# Patient Record
Sex: Female | Born: 1955 | Race: White | Hispanic: No | Smoking: Never smoker
Health system: Southern US, Community
[De-identification: ages and names within clinical notes are randomized; demographics above are authoritative.]

## PROBLEM LIST (undated history)

## (undated) DIAGNOSIS — H9190 Unspecified hearing loss, unspecified ear: Secondary | ICD-10-CM

## (undated) DIAGNOSIS — F419 Anxiety disorder, unspecified: Secondary | ICD-10-CM

## (undated) DIAGNOSIS — F32A Depression, unspecified: Secondary | ICD-10-CM

## (undated) DIAGNOSIS — D72819 Decreased white blood cell count, unspecified: Secondary | ICD-10-CM

## (undated) DIAGNOSIS — B059 Measles without complication: Secondary | ICD-10-CM

## (undated) DIAGNOSIS — K589 Irritable bowel syndrome without diarrhea: Secondary | ICD-10-CM

## (undated) DIAGNOSIS — R011 Cardiac murmur, unspecified: Secondary | ICD-10-CM

## (undated) DIAGNOSIS — F329 Major depressive disorder, single episode, unspecified: Secondary | ICD-10-CM

## (undated) DIAGNOSIS — G4700 Insomnia, unspecified: Secondary | ICD-10-CM

## (undated) DIAGNOSIS — G250 Essential tremor: Secondary | ICD-10-CM

## (undated) DIAGNOSIS — B019 Varicella without complication: Secondary | ICD-10-CM

## (undated) DIAGNOSIS — D649 Anemia, unspecified: Secondary | ICD-10-CM

## (undated) DIAGNOSIS — R32 Unspecified urinary incontinence: Secondary | ICD-10-CM

## (undated) DIAGNOSIS — F909 Attention-deficit hyperactivity disorder, unspecified type: Secondary | ICD-10-CM

## (undated) DIAGNOSIS — H269 Unspecified cataract: Secondary | ICD-10-CM

## (undated) DIAGNOSIS — I493 Ventricular premature depolarization: Secondary | ICD-10-CM

## (undated) DIAGNOSIS — R002 Palpitations: Secondary | ICD-10-CM

## (undated) DIAGNOSIS — M1990 Unspecified osteoarthritis, unspecified site: Secondary | ICD-10-CM

## (undated) DIAGNOSIS — I341 Nonrheumatic mitral (valve) prolapse: Secondary | ICD-10-CM

## (undated) HISTORY — DX: Nonrheumatic mitral (valve) prolapse: I34.1

## (undated) HISTORY — DX: Decreased white blood cell count, unspecified: D72.819

## (undated) HISTORY — DX: Anemia, unspecified: D64.9

## (undated) HISTORY — DX: Varicella without complication: B01.9

## (undated) HISTORY — DX: Depression, unspecified: F32.A

## (undated) HISTORY — DX: Irritable bowel syndrome, unspecified: K58.9

## (undated) HISTORY — DX: Ventricular premature depolarization: I49.3

## (undated) HISTORY — DX: Unspecified osteoarthritis, unspecified site: M19.90

## (undated) HISTORY — DX: Unspecified hearing loss, unspecified ear: H91.90

## (undated) HISTORY — DX: Cardiac murmur, unspecified: R01.1

## (undated) HISTORY — DX: Essential tremor: G25.0

## (undated) HISTORY — DX: Insomnia, unspecified: G47.00

## (undated) HISTORY — DX: Anxiety disorder, unspecified: F41.9

## (undated) HISTORY — DX: Major depressive disorder, single episode, unspecified: F32.9

## (undated) HISTORY — DX: Unspecified cataract: H26.9

## (undated) HISTORY — DX: Unspecified urinary incontinence: R32

## (undated) HISTORY — DX: Palpitations: R00.2

## (undated) HISTORY — DX: Measles without complication: B05.9

## (undated) HISTORY — DX: Attention-deficit hyperactivity disorder, unspecified type: F90.9

---

## 1982-07-29 HISTORY — PX: INGUINAL HERNIA REPAIR: SUR1180

## 1991-07-30 HISTORY — PX: KNEE ARTHROSCOPY: SUR90

## 2005-07-15 ENCOUNTER — Ambulatory Visit: Payer: Self-pay | Admitting: Family Medicine

## 2005-08-22 ENCOUNTER — Ambulatory Visit: Payer: Self-pay | Admitting: General Surgery

## 2006-02-27 ENCOUNTER — Ambulatory Visit: Payer: Self-pay | Admitting: Internal Medicine

## 2009-04-12 ENCOUNTER — Encounter: Payer: Self-pay | Admitting: Family Medicine

## 2009-04-28 ENCOUNTER — Encounter: Payer: Self-pay | Admitting: Family Medicine

## 2009-05-29 ENCOUNTER — Encounter: Payer: Self-pay | Admitting: Family Medicine

## 2015-04-28 ENCOUNTER — Ambulatory Visit (INDEPENDENT_AMBULATORY_CARE_PROVIDER_SITE_OTHER): Payer: BLUE CROSS/BLUE SHIELD | Admitting: Primary Care

## 2015-04-28 ENCOUNTER — Encounter: Payer: Self-pay | Admitting: Primary Care

## 2015-04-28 ENCOUNTER — Encounter (INDEPENDENT_AMBULATORY_CARE_PROVIDER_SITE_OTHER): Payer: Self-pay

## 2015-04-28 VITALS — BP 124/84 | HR 90 | Temp 97.9°F | Ht 70.0 in | Wt 183.1 lb

## 2015-04-28 DIAGNOSIS — F909 Attention-deficit hyperactivity disorder, unspecified type: Secondary | ICD-10-CM | POA: Diagnosis not present

## 2015-04-28 DIAGNOSIS — F329 Major depressive disorder, single episode, unspecified: Secondary | ICD-10-CM

## 2015-04-28 DIAGNOSIS — F32A Depression, unspecified: Secondary | ICD-10-CM

## 2015-04-28 MED ORDER — BUPROPION HCL ER (XL) 300 MG PO TB24: 300.0000 mg | ORAL_TABLET | Freq: Every day | ORAL | Status: DC

## 2015-04-28 NOTE — Assessment & Plan Note (Signed)
Diagnosed in [place] by psychologist 10 years ago. She is currently managed on Wellbutrin XL 300 mg daily. She is interested in being restested and would like to do so in [place]. Offered information with [place], she prefers [place]. She will call and notify me of her visit and is to fax me your report.

## 2015-04-28 NOTE — Progress Notes (Signed)
Subjective:    Patient ID: Karen Prince, female    DOB: 11-Nov-1955, 59 y.o.   MRN: [id number]  HPI  Karen Prince is a 59 year old female who presents today to establish care and discuss the problems mentioned below. Will obtain old records. Her last physical was earlier this year.  1) Depression and ADHD: Diagnosed in March of 2007. She has been on Wellbutrin XL 300 mg daily for years. She feels decently managed on this medication at this dose. She has not been evaluated by psychiatry recently. She was once evaluated by a clinical psychologist for ADHD 10 years ago in [place] and would like re-evaluation. PHQ 2 score of 0 today. She is requesting a refill of her medication today.  2) Insomnia: Present most of her life. She has difficulty falling asleep and is managed on Sonata as needed. She doesn't wake up during the night, denies snoring, restless leg but will have occasional "squirmy legs".     Review of Systems  Constitutional: Negative for unexpected weight change.  HENT: Negative for rhinorrhea.   Respiratory: Negative for cough and shortness of breath.   Cardiovascular: Negative for chest pain.  Genitourinary: Negative for difficulty urinating.  Musculoskeletal: Positive for arthralgias.       Chronic shoulder and knee pain, not bothersome daily  Skin: Negative for rash.  Allergic/Immunologic: Negative for environmental allergies.  Neurological: Negative for dizziness, numbness and headaches.  Psychiatric/Behavioral:       See HPI       Past Medical History  Diagnosis Date  . Mitral valve prolapse   . Anemia   . ADHD (attention deficit hyperactivity disorder)   . Depression   . IBS (irritable bowel syndrome)   . Insomnia   . Essential tremor   . PVC (premature ventricular contraction)     Social History   Social History  . Marital Status: Single    Spouse Name: N/A  . Number of Children: N/A  . Years of Education: N/A   Occupational History  . Not on file.    Social History Main Topics  . Smoking status: Never Smoker   . Smokeless tobacco: Not on file  . Alcohol Use: 0.0 oz/week    0 Standard drinks or equivalent per week     Comment: occ  . Drug Use: Not on file  . Sexual Activity: Not on file   Other Topics Concern  . Not on file   Social History Narrative   Single, has a partner.   Works as a Professor of Physical Therapy.   Works at [employer].   Enjoys photography, travel, being outdoors, relaxing.    No past surgical history on file.  Family History  Problem Relation Age of Onset  . Hypertension Father   . Macular degeneration Father   . Heart disease Father   . Melanoma Mother   . Anuerysm Mother     Abdominal    No Known Allergies  No current outpatient prescriptions on file prior to visit.   No current facility-administered medications on file prior to visit.    BP 124/84 mmHg  Pulse 90  Temp(Src) 97.9 F (36.6 C) (Oral)  Ht 5\' 10"  (1.778 m)  Wt 183 lb 1.9 oz (83.063 kg)  BMI 26.28 kg/m2  SpO2 98%    Objective:   Physical Exam  Constitutional: She is oriented to person, place, and time. She appears well-nourished.  Cardiovascular: Normal rate and regular rhythm.   Pulmonary/Chest: Effort normal and  breath sounds normal.  Neurological: She is alert and oriented to person, place, and time.  Skin: Skin is warm and dry.  Psychiatric: She has a normal mood and affect.          Assessment & Plan:

## 2015-04-28 NOTE — Progress Notes (Signed)
Pre visit review using our clinic review tool, if applicable. No additional management support is needed unless otherwise documented below in the visit note. 

## 2015-04-28 NOTE — Assessment & Plan Note (Signed)
Diagnosed in March 2007 and is currently managed on Wellbutrin XL 300 mg daily. She feels well managed at this dose. PHQ 2 score of 0 today. Denies SI/HI. Will continue to monitor.

## 2015-04-28 NOTE — Patient Instructions (Addendum)
Refills have been provided of your Wellbutrin XL 300 mg.   Please contact the [facility] office regarding re-evaluation and testing and notify me if you need any assistance. Please have them fax their report to me.  Follow up in 6 months for re-evaluation.  It was a pleasure to meet you today! Please don't hesitate to call me with any questions. Welcome to [facility]!

## 2015-07-26 ENCOUNTER — Other Ambulatory Visit: Payer: Self-pay | Admitting: Primary Care

## 2015-10-26 ENCOUNTER — Encounter: Payer: Self-pay | Admitting: Primary Care

## 2015-10-26 ENCOUNTER — Ambulatory Visit (INDEPENDENT_AMBULATORY_CARE_PROVIDER_SITE_OTHER): Payer: BLUE CROSS/BLUE SHIELD | Admitting: Primary Care

## 2015-10-26 VITALS — BP 124/74 | HR 105 | Temp 99.0°F | Ht 70.0 in | Wt 185.8 lb

## 2015-10-26 DIAGNOSIS — G4700 Insomnia, unspecified: Secondary | ICD-10-CM

## 2015-10-26 DIAGNOSIS — F32A Depression, unspecified: Secondary | ICD-10-CM

## 2015-10-26 DIAGNOSIS — Z1211 Encounter for screening for malignant neoplasm of colon: Secondary | ICD-10-CM | POA: Diagnosis not present

## 2015-10-26 DIAGNOSIS — N390 Urinary tract infection, site not specified: Secondary | ICD-10-CM

## 2015-10-26 DIAGNOSIS — F329 Major depressive disorder, single episode, unspecified: Secondary | ICD-10-CM | POA: Diagnosis not present

## 2015-10-26 LAB — POC URINALSYSI DIPSTICK (AUTOMATED)
Bilirubin, UA: NEGATIVE
Blood, UA: NEGATIVE
Glucose, UA: NEGATIVE
Ketones, UA: NEGATIVE
NITRITE UA: POSITIVE
PH UA: 6
Spec Grav, UA: 1.02
UROBILINOGEN UA: NEGATIVE

## 2015-10-26 MED ORDER — CIPROFLOXACIN HCL 500 MG PO TABS: 500.0000 mg | ORAL_TABLET | Freq: Two times a day (BID) | ORAL | Status: DC

## 2015-10-26 NOTE — Progress Notes (Signed)
Subjective:    Patient ID: Karen Prince, female    DOB: 15-Jan-1956, 60 y.o.   MRN: [id number]  HPI  Karen Prince is a 60 year old female who presents today for follow up and a chief complaint of flank pain.  1) Depression: Currently following with Dr. Nicolasa Ducking with psychiatry. Diagnosed in March 2007, and currently managed on Wellbutrin XL 300 mg. She is also managed on Cymbalta 60 mg and Zaleplon 10 mg. The Cymbalta was initiated in December 2016 by Dr. Nicolasa Ducking. Her next follow up with Dr. Nicolasa Ducking is in 1 month.   2) Flank Pain: She was recently evaluated by her GYN for complaints of vaginal discharge with foul odor. She was diagnosed with bacterial vaginosis and completed Metrogel on Friday March 24. She noticed pelvic pressure, hematuria, and cloudy urine that began on Thursday March 23rd. 2 days ago she noticed bilateral flank pain, and this morning she's noticed "side pain". Denies fevers. She's taken advil cold and sinus last night as she's also had a cough. Overall her cough is improved.   3) Cough: Present for the past 2 weeks. Overall feeling better. Denies fevers, shortness of breath. Her cough was productive with clear/yellow sputum.   Review of Systems  Constitutional: Negative for fever and fatigue.  HENT: Negative for congestion and sinus pressure.   Respiratory: Positive for cough. Negative for shortness of breath.   Genitourinary: Positive for frequency, hematuria, flank pain and pelvic pain. Negative for dysuria.       Past Medical History  Diagnosis Date  . Mitral valve prolapse   . Anemia   . ADHD (attention deficit hyperactivity disorder)   . Depression   . IBS (irritable bowel syndrome)   . Insomnia   . Essential tremor   . PVC (premature ventricular contraction)   . Hearing loss   . Leucopenia   . Chicken pox   . Measles     Social History   Social History  . Marital Status: Single    Spouse Name: N/A  . Number of Children: N/A  . Years of Education:  N/A   Occupational History  . Not on file.   Social History Main Topics  . Smoking status: Never Smoker   . Smokeless tobacco: Not on file  . Alcohol Use: 0.0 oz/week    0 Standard drinks or equivalent per week     Comment: occ  . Drug Use: Not on file  . Sexual Activity: Not on file   Other Topics Concern  . Not on file   Social History Narrative   Single, has a partner.   Works as a Professor of Physical Therapy.   Works at [employer].   Enjoys photography, travel, being outdoors, relaxing.    Past Surgical History  Procedure Laterality Date  . Inguinal hernia repair  1984  . Knee arthroscopy Right 1993    Family History  Problem Relation Age of Onset  . Hypertension Father   . Macular degeneration Father   . Heart disease Father   . Melanoma Mother   . Anuerysm Mother     Abdominal  . Emphysema Mother   . Emphysema Father   .       No Known Allergies  Current Outpatient Prescriptions on File Prior to Visit  Medication Sig Dispense Refill  . Cholecalciferol (VITAMIN D3) 2000 UNITS capsule Take 2,000 Units by mouth daily.     . OMEGA-3 FATTY ACIDS-VITAMIN E PO Take 1,000 mg by mouth daily.    Marland Kitchen  zaleplon (SONATA) 10 MG capsule Take by mouth.     No current facility-administered medications on file prior to visit.    BP 124/74 mmHg  Pulse 105  Temp(Src) 99 F (37.2 C) (Oral)  Ht 5\' 10"  (1.778 m)  Wt 185 lb 12.8 oz (84.278 kg)  BMI 26.66 kg/m2  SpO2 98%    Objective:   Physical Exam  Constitutional: She appears well-nourished.  Cardiovascular: Normal rate and regular rhythm.   Pulmonary/Chest: Effort normal and breath sounds normal. She has no wheezes. She has no rales.  Abdominal: Soft. Bowel sounds are normal. There is no tenderness. There is no CVA tenderness.  Skin: Skin is warm and dry.          Assessment & Plan:  Urinary Tract Infection:  Recent treatment for BV per GYN. Now with flank pain, pelvic discomfort, frequency. Exam  unremarkable. UA: 3+ leuks, positive for nitrites, negative for blood.  Culture sent. Will treat with Cipro course. 7 days necessary due to recent treatment for BV. Return precautions provided.

## 2015-10-26 NOTE — Assessment & Plan Note (Signed)
Long history of, managed on Zaleplon per psych.

## 2015-10-26 NOTE — Patient Instructions (Signed)
Start ciprofloxacin antibiotics. Take 1 tablet by mouth twice daily for 7 days.  You will be contacted regarding your Colonoscopy.  Please let us know if you have not heard back within one week.   Please schedule a physical with me within the next 3 months. You may also schedule a lab only appointment 3-4 days prior. We will discuss your lab results in detail during your physical.  It was a pleasure to see you today!  Urinary Tract Infection Urinary tract infections (UTIs) can develop anywhere along your urinary tract. Your urinary tract is your body's drainage system for removing wastes and extra water. Your urinary tract includes two kidneys, two ureters, a bladder, and a urethra. Your kidneys are a pair of bean-shaped organs. Each kidney is about the size of your fist. They are located below your ribs, one on each side of your spine. CAUSES Infections are caused by microbes, which are microscopic organisms, including fungi, viruses, and bacteria. These organisms are so small that they can only be seen through a microscope. Bacteria are the microbes that most commonly cause UTIs. SYMPTOMS  Symptoms of UTIs may vary by age and gender of the patient and by the location of the infection. Symptoms in young women typically include a frequent and intense urge to urinate and a painful, burning feeling in the bladder or urethra during urination. Older women and men are more likely to be tired, shaky, and weak and have muscle aches and abdominal pain. A fever may mean the infection is in your kidneys. Other symptoms of a kidney infection include pain in your back or sides below the ribs, nausea, and vomiting. DIAGNOSIS To diagnose a UTI, your caregiver will ask you about your symptoms. Your caregiver will also ask you to provide a urine sample. The urine sample will be tested for bacteria and white blood cells. White blood cells are made by your body to help fight infection. TREATMENT  Typically, UTIs can  be treated with medication. Because most UTIs are caused by a bacterial infection, they usually can be treated with the use of antibiotics. The choice of antibiotic and length of treatment depend on your symptoms and the type of bacteria causing your infection. HOME CARE INSTRUCTIONS  If you were prescribed antibiotics, take them exactly as your caregiver instructs you. Finish the medication even if you feel better after you have only taken some of the medication.  Drink enough water and fluids to keep your urine clear or pale yellow.  Avoid caffeine, tea, and carbonated beverages. They tend to irritate your bladder.  Empty your bladder often. Avoid holding urine for long periods of time.  Empty your bladder before and after sexual intercourse.  After a bowel movement, women should cleanse from front to back. Use each tissue only once. SEEK MEDICAL CARE IF:   You have back pain.  You develop a fever.  Your symptoms do not begin to resolve within 3 days. SEEK IMMEDIATE MEDICAL CARE IF:   You have severe back pain or lower abdominal pain.  You develop chills.  You have nausea or vomiting.  You have continued burning or discomfort with urination. MAKE SURE YOU:   Understand these instructions.  Will watch your condition.  Will get help right away if you are not doing well or get worse.   This information is not intended to replace advice given to you by your health care provider. Make sure you discuss any questions you have with your health care  provider.   Document Released: 04/24/2005 Document Revised: 04/05/2015 Document Reviewed: 08/23/2011 Elsevier Interactive Patient Education [employer].

## 2015-10-26 NOTE — Progress Notes (Signed)
Pre visit review using our clinic review tool, if applicable. No additional management support is needed unless otherwise documented below in the visit note. 

## 2015-10-26 NOTE — Assessment & Plan Note (Signed)
Currently following with Psych. Managed on Wellbutrin XL 300 mg and Cymbalta 60 mg. Feels well managed, next follow up due in 1 month.

## 2015-10-28 LAB — URINE CULTURE: Colony Count: 80000

## 2016-01-04 ENCOUNTER — Ambulatory Visit: Payer: BLUE CROSS/BLUE SHIELD | Admitting: Anesthesiology

## 2016-01-04 ENCOUNTER — Encounter: Payer: Self-pay | Admitting: Anesthesiology

## 2016-01-04 ENCOUNTER — Ambulatory Visit
Admission: RE | Admit: 2016-01-04 | Discharge: 2016-01-04 | Disposition: A | Discharge status: Home / Self Care | Payer: BLUE CROSS/BLUE SHIELD | Source: Ambulatory Visit | Attending: Unknown Physician Specialty | Admitting: Unknown Physician Specialty

## 2016-01-04 ENCOUNTER — Encounter
Admission: RE | Disposition: A | Discharge status: Home / Self Care | Payer: Self-pay | Source: Ambulatory Visit | Attending: Unknown Physician Specialty

## 2016-01-04 DIAGNOSIS — K640 First degree hemorrhoids: Secondary | ICD-10-CM | POA: Diagnosis not present

## 2016-01-04 DIAGNOSIS — G250 Essential tremor: Secondary | ICD-10-CM | POA: Diagnosis not present

## 2016-01-04 DIAGNOSIS — Z1211 Encounter for screening for malignant neoplasm of colon: Secondary | ICD-10-CM | POA: Insufficient documentation

## 2016-01-04 DIAGNOSIS — F909 Attention-deficit hyperactivity disorder, unspecified type: Secondary | ICD-10-CM | POA: Insufficient documentation

## 2016-01-04 DIAGNOSIS — D120 Benign neoplasm of cecum: Secondary | ICD-10-CM | POA: Insufficient documentation

## 2016-01-04 DIAGNOSIS — I341 Nonrheumatic mitral (valve) prolapse: Secondary | ICD-10-CM | POA: Diagnosis not present

## 2016-01-04 DIAGNOSIS — F329 Major depressive disorder, single episode, unspecified: Secondary | ICD-10-CM | POA: Insufficient documentation

## 2016-01-04 DIAGNOSIS — K5730 Diverticulosis of large intestine without perforation or abscess without bleeding: Secondary | ICD-10-CM | POA: Diagnosis not present

## 2016-01-04 HISTORY — PX: COLONOSCOPY WITH PROPOFOL: SHX5780

## 2016-01-04 SURGERY — COLONOSCOPY WITH PROPOFOL
Anesthesia: General

## 2016-01-04 MED ORDER — PROPOFOL 500 MG/50ML IV EMUL
INTRAVENOUS | Status: DC | PRN
Start: 2016-01-04 — End: 2016-01-04
  Administered 2016-01-04: 200 ug/kg/min via INTRAVENOUS

## 2016-01-04 MED ORDER — LIDOCAINE HCL (PF) 1 % IJ SOLN
2.0000 mL | Freq: Once | INTRAMUSCULAR | Status: AC
  Administered 2016-01-04: 0.03 mL via INTRADERMAL

## 2016-01-04 MED ORDER — PROPOFOL 10 MG/ML IV BOLUS
INTRAVENOUS | Status: DC | PRN
  Administered 2016-01-04: 40 mg via INTRAVENOUS
  Administered 2016-01-04: 60 mg via INTRAVENOUS

## 2016-01-04 MED ORDER — SODIUM CHLORIDE 0.9 % IV SOLN
INTRAVENOUS | Status: DC
  Administered 2016-01-04: 1000 mL via INTRAVENOUS

## 2016-01-04 MED ORDER — FENTANYL CITRATE (PF) 100 MCG/2ML IJ SOLN
INTRAMUSCULAR | Status: DC | PRN
  Administered 2016-01-04: 50 ug via INTRAVENOUS

## 2016-01-04 MED ORDER — SODIUM CHLORIDE 0.9 % IV SOLN: INTRAVENOUS | Status: DC

## 2016-01-04 MED ORDER — LIDOCAINE HCL (PF) 1 % IJ SOLN
INTRAMUSCULAR | Status: AC
  Administered 2016-01-04: 0.03 mL via INTRADERMAL
  Filled 2016-01-04: qty 2

## 2016-01-04 NOTE — H&P (Signed)
Primary Care Physician:  Sheral Flow, NP Primary Gastroenterologist:  Dr. Vira Agar  Pre-Procedure History & Physical: HPI:  Karen Prince is a 60 y.o. female is here for an colonoscopy.   Past Medical History  Diagnosis Date  . Mitral valve prolapse   . Anemia   . ADHD (attention deficit hyperactivity disorder)   . Depression   . IBS (irritable bowel syndrome)   . Insomnia   . Essential tremor   . PVC (premature ventricular contraction)   . Hearing loss     follows with audologist  . Leucopenia     has blood draw every 5 years  . Chicken pox   . Measles     Past Surgical History  Procedure Laterality Date  . Inguinal hernia repair  1984  . Knee arthroscopy Right 1993    Prior to Admission medications   Medication Sig Start Date End Date Taking? Authorizing Provider  buPROPion (WELLBUTRIN SR) 150 MG 12 hr tablet Take 300 mg by mouth 2 (two) times daily.    Historical Provider, MD  Cholecalciferol (VITAMIN D3) 2000 UNITS capsule Take 2,000 Units by mouth daily.     Historical Provider, MD  ciprofloxacin (CIPRO) 500 MG tablet Take 1 tablet (500 mg total) by mouth 2 (two) times daily. 10/26/15   Pleas Koch, NP  DULoxetine (CYMBALTA) 60 MG capsule Take 60 mg by mouth daily.  09/04/15   Historical Provider, MD  OMEGA-3 FATTY ACIDS-VITAMIN E PO Take 1,000 mg by mouth daily.    Historical Provider, MD  zaleplon (SONATA) 10 MG capsule Take by mouth.    Historical Provider, MD    Allergies as of 12/21/2015  . (No Known Allergies)    Family History  Problem Relation Age of Onset  . Hypertension Father   . Macular degeneration Father   . Heart disease Father   . Melanoma Mother   . Anuerysm Mother     Abdominal  . Emphysema Mother   . Emphysema Father   .       Social History   Social History  . Marital Status: Single    Spouse Name: N/A  . Number of Children: N/A  . Years of Education: N/A   Occupational History  . Not on file.   Social  History Main Topics  . Smoking status: Never Smoker   . Smokeless tobacco: Not on file  . Alcohol Use: 0.0 oz/week    0 Standard drinks or equivalent per week     Comment: occ  . Drug Use: Not on file  . Sexual Activity: Not on file   Other Topics Concern  . Not on file   Social History Narrative   Single, has a partner.   Works as a Professor of Physical Therapy.   Works at [employer].   Enjoys photography, travel, being outdoors, relaxing.    Review of Systems: See HPI, otherwise negative ROS  Physical Exam: BP 140/83 mmHg  Pulse 92  Temp(Src) 98.6 F (37 C) (Tympanic)  Resp 16  Ht 5\' 9"  (1.753 m)  Wt 81.647 kg (180 lb)  BMI 26.57 kg/m2  SpO2 97% General:   Alert,  pleasant and cooperative in NAD Head:  Normocephalic and atraumatic. Neck:  Supple; no masses or thyromegaly. Lungs:  Clear throughout to auscultation.    Heart:  Regular rate and rhythm. Abdomen:  Soft, nontender and nondistended. Normal bowel sounds, without guarding, and without rebound.   Neurologic:  Alert and  oriented x4;  grossly normal neurologically.  Impression/Plan: Karen Prince is here for an colonoscopy to be performed for screening.  Risks, benefits, limitations, and alternatives regarding  colonoscopy have been reviewed with the patient.  Questions have been answered.  All parties agreeable.   Gaylyn Cheers, MD  01/04/2016, 3:35 PM

## 2016-01-04 NOTE — Transfer of Care (Signed)
Immediate Anesthesia Transfer of Care Note  Patient: Karen Prince  Procedure(s) Performed: Procedure(s): COLONOSCOPY WITH PROPOFOL (N/A)  Patient Location: Endoscopy Unit  Anesthesia Type:General  Level of Consciousness: awake, alert  and oriented  Airway & Oxygen Therapy: Patient connected to nasal cannula oxygen  Post-op Assessment: Post -op Vital signs reviewed and stable  Post vital signs: stable  Last Vitals:  Filed Vitals:   01/04/16 1614 01/04/16 1615  BP: 131/79 131/79  Pulse: 81 80  Temp: 35.9 C 35.9 C  Resp: 17 17    Last Pain: There were no vitals filed for this visit.       Complications: No apparent anesthesia complications

## 2016-01-04 NOTE — Anesthesia Preprocedure Evaluation (Signed)
Anesthesia Evaluation  Patient identified by MRN, date of birth, ID band Patient awake    Reviewed: Allergy & Precautions, H&P , NPO status , Patient's Chart, lab work & pertinent test results  History of Anesthesia Complications Negative for: history of anesthetic complications  Airway Mallampati: III  TM Distance: <3 FB Neck ROM: limited    Dental  (+) Poor Dentition   Pulmonary neg pulmonary ROS, neg shortness of breath,    Pulmonary exam normal breath sounds clear to auscultation       Cardiovascular Exercise Tolerance: Good (-) angina(-) Past MI and (-) DOE Normal cardiovascular exam+ Valvular Problems/Murmurs  Rhythm:regular Rate:Normal     Neuro/Psych PSYCHIATRIC DISORDERS Depression negative neurological ROS     GI/Hepatic negative GI ROS, Neg liver ROS,   Endo/Other  negative endocrine ROS  Renal/GU negative Renal ROS  negative genitourinary   Musculoskeletal   Abdominal   Peds  Hematology negative hematology ROS (+)   Anesthesia Other Findings Past Medical History:   Mitral valve prolapse                                        Anemia                                                       ADHD (attention deficit hyperactivity disorder)              Depression                                                   IBS (irritable bowel syndrome)                               Insomnia                                                     Essential tremor                                             PVC (premature ventricular contraction)                      Hearing loss                                                   Comment:follows with audologist   Leucopenia  Comment:has blood draw every 5 years   Chicken pox                                                  Measles                                                     Past Surgical History:   INGUINAL  HERNIA REPAIR                           1984         KNEE ARTHROSCOPY                                Right 1993        BMI    Body Mass Index   26.56 kg/m 2      Reproductive/Obstetrics negative OB ROS                             Anesthesia Physical Anesthesia Plan  ASA: III  Anesthesia Plan: General   Post-op Pain Management:    Induction:   Airway Management Planned:   Additional Equipment:   Intra-op Plan:   Post-operative Plan:   Informed Consent: I have reviewed the patients History and Physical, chart, labs and discussed the procedure including the risks, benefits and alternatives for the proposed anesthesia with the patient or authorized representative who has indicated his/her understanding and acceptance.   Dental Advisory Given  Plan Discussed with: Anesthesiologist, CRNA and Surgeon  Anesthesia Plan Comments:         Anesthesia Quick Evaluation

## 2016-01-04 NOTE — Op Note (Signed)
[facility] Gastroenterology Patient Name: Karen Prince Procedure Date: 01/04/2016 3:32 PM MRN: [id number] Account #: [id number] Date of Birth: 1956/03/28 Admit Type: Outpatient Age: 60 Room: [facility] ENDO ROOM 4 Gender: Female Note Status: Finalized Procedure:            Colonoscopy Indications:          Screening for colorectal malignant neoplasm Providers:            Manya Silvas, MD Medicines:            Propofol per Anesthesia Complications:        No immediate complications. Procedure:            Pre-Anesthesia Assessment:                       - After reviewing the risks and benefits, the patient                        was deemed in satisfactory condition to undergo the                        procedure.                       After obtaining informed consent, the colonoscope was                        passed under direct vision. Throughout the procedure,                        the patient's blood pressure, pulse, and oxygen                        saturations were monitored continuously. The Olympus                        PCF-160AL colonoscope (S#. [id number]) was introduced                        through the anus and advanced to the the cecum,                        identified by appendiceal orifice and ileocecal valve.                        The colonoscopy was performed without difficulty. The                        patient tolerated the procedure well. The quality of                        the bowel preparation was excellent. Findings:      Two sessile polyps were found in the cecum. The polyps were diminutive       in size. These polyps were removed with a cold biopsy forceps. Resection       and retrieval were complete.      A few small-mouthed diverticula were found in the sigmoid colon and       descending colon.      Internal hemorrhoids were found during endoscopy. The hemorrhoids were       small and Grade I (  internal hemorrhoids that do not  prolapse).      The exam was otherwise without abnormality. Impression:           - Two diminutive polyps in the cecum, removed with a                        cold biopsy forceps. Resected and retrieved.                       - Diverticulosis in the sigmoid colon and in the                        descending colon.                       - Internal hemorrhoids.                       - The examination was otherwise normal. Recommendation:       - Await pathology results. Manya Silvas, MD 01/04/2016 4:30:33 PM This report has been signed electronically. Number of Addenda: 0 Note Initiated On: 01/04/2016 3:32 PM Scope Withdrawal Time: 0 hours 29 minutes 0 seconds  Total Procedure Duration: 0 hours 40 minutes 41 seconds       [facility]

## 2016-01-05 ENCOUNTER — Encounter: Payer: Self-pay | Admitting: Unknown Physician Specialty

## 2016-01-05 NOTE — Anesthesia Postprocedure Evaluation (Signed)
Anesthesia Post Note  Patient: Karen Prince  Procedure(s) Performed: Procedure(s) (LRB): COLONOSCOPY WITH PROPOFOL (N/A)  Patient location during evaluation: Endoscopy Anesthesia Type: General Level of consciousness: awake and alert Pain management: pain level controlled Vital Signs Assessment: post-procedure vital signs reviewed and stable Respiratory status: spontaneous breathing, nonlabored ventilation, respiratory function stable and patient connected to nasal cannula oxygen Cardiovascular status: blood pressure returned to baseline and stable Postop Assessment: no signs of nausea or vomiting Anesthetic complications: no    Last Vitals:  Filed Vitals:   01/04/16 1634 01/04/16 1644  BP: 125/82 128/83  Pulse: 79 70  Temp:    Resp: 16 24    Last Pain:  Filed Vitals:   01/05/16 0743  PainSc: 0-No pain                 Precious Haws Piscitello

## 2016-01-09 LAB — SURGICAL PATHOLOGY

## 2016-06-24 ENCOUNTER — Telehealth: Payer: Self-pay | Admitting: Primary Care

## 2016-06-24 DIAGNOSIS — Z0000 Encounter for general adult medical examination without abnormal findings: Secondary | ICD-10-CM

## 2016-06-24 NOTE — Telephone Encounter (Signed)
Please clarify, is this a [employer] [facility] [facility] drawing station? Also, I do not do hormone testing. Is there something specifically she's requesting?

## 2016-06-24 NOTE — Telephone Encounter (Signed)
Pt has cpx 06/28/16  She wants labs done at [facility] She also wants hormone levels done along with the other labs  Please advise when ready for pick I[

## 2016-06-25 ENCOUNTER — Other Ambulatory Visit: Payer: Self-pay | Admitting: *Deleted

## 2016-06-25 DIAGNOSIS — Z0000 Encounter for general adult medical examination without abnormal findings: Secondary | ICD-10-CM

## 2016-06-25 NOTE — Telephone Encounter (Signed)
Noted. Orders placed, please release.

## 2016-06-25 NOTE — Telephone Encounter (Signed)
Patient called Karen Prince back. She said the fax number to [facility] is [phone]. You have to dial all numbers.

## 2016-06-25 NOTE — Telephone Encounter (Signed)
Spoken to patient and the labs do go through [facility]. So we can release them and I can fax the orders for the patient.  Patient stated that any blood test regarding menopause if possible.

## 2016-06-26 NOTE — Addendum Note (Signed)
Addended by: Ellamae Sia on: 06/26/2016 08:10 AM   Modules accepted: Orders

## 2016-06-26 NOTE — Telephone Encounter (Signed)
Orders were released this morning. Tried to fax this morning with to the faxed number below. The faxed did not go thru 2 times.  Called the health and wellness at [facility]. Was given fax number [phone].  Faxed lab orders to the number above.

## 2016-06-28 ENCOUNTER — Encounter: Payer: Self-pay | Admitting: Primary Care

## 2016-06-28 ENCOUNTER — Ambulatory Visit (INDEPENDENT_AMBULATORY_CARE_PROVIDER_SITE_OTHER): Payer: BLUE CROSS/BLUE SHIELD | Admitting: Primary Care

## 2016-06-28 VITALS — BP 118/78 | HR 82 | Temp 98.3°F | Ht 69.0 in | Wt 187.0 lb

## 2016-06-28 DIAGNOSIS — Z0001 Encounter for general adult medical examination with abnormal findings: Secondary | ICD-10-CM | POA: Insufficient documentation

## 2016-06-28 DIAGNOSIS — F909 Attention-deficit hyperactivity disorder, unspecified type: Secondary | ICD-10-CM | POA: Diagnosis not present

## 2016-06-28 DIAGNOSIS — Z23 Encounter for immunization: Secondary | ICD-10-CM

## 2016-06-28 DIAGNOSIS — R32 Unspecified urinary incontinence: Secondary | ICD-10-CM | POA: Insufficient documentation

## 2016-06-28 DIAGNOSIS — Z0000 Encounter for general adult medical examination without abnormal findings: Secondary | ICD-10-CM

## 2016-06-28 MED ORDER — ZOSTER VACCINE LIVE 19400 UNT/0.65ML ~~LOC~~ SUSR: 0.6500 mL | Freq: Once | SUBCUTANEOUS | 0 refills | Status: AC

## 2016-06-28 NOTE — Assessment & Plan Note (Signed)
Completed sleep study which was negative for OSA. Using Sonata more sparingly.

## 2016-06-28 NOTE — Assessment & Plan Note (Signed)
Stable. Following with psychiatry, feels well managed. Denies SI/HI.

## 2016-06-28 NOTE — Assessment & Plan Note (Signed)
Tetanus UTD, declines influenza. Rx for Zostavax printed for her to fill at pharmacy. Mammogram and Pap UTD, follows with GYN. Colonoscopy UTD, due in 2022. Exam unremarkable. Labs pending. Discussed the importance of a healthy diet and regular exercise in order for weight loss, and to reduce the risk of other medical diseases. Follow up in 1 year for annual physical.

## 2016-06-28 NOTE — Progress Notes (Signed)
Pre visit review using our clinic review tool, if applicable. No additional management support is needed unless otherwise documented below in the visit note. 

## 2016-06-28 NOTE — Patient Instructions (Signed)
Call your insurance to ensure they will cover the shingles vaccination. Take the prescription to the pharmacy for administration.  You will be contacted regarding your referral to Physical Therapy.  Please let us know if you have not heard back within one week.   It's importance to improve your diet by reducing consumption of fast food, fried food, processed snack foods, sugary drinks. Increase consumption of fresh vegetables and fruits, whole grains, water.  Ensure you are drinking 64 ounces of water daily.  Start exercising. You should be getting 150 minutes of moderate intensity exercise weekly.  We will notify you once we receive your lab results.  Follow up in 1 year for repeat physical or sooner if needed.  It was a pleasure to see you today!

## 2016-06-28 NOTE — Assessment & Plan Note (Signed)
History of stress incontinence intermittently for years, managed with Kegel exercises. Starting to notice leakage after using the bathroom. Referral placed to physical therapy for evaluation and treatment.

## 2016-06-28 NOTE — Progress Notes (Signed)
Subjective:    Patient ID: Karen Prince, female    DOB: [birth year], 60 y.o.   MRN: [id number]  HPI  Karen Prince is a 60 year old female who presents today for complete physical.  Immunizations: -Tetanus: DPT in 2012 -Influenza: Declines -Shingles: Due.  Diet: She endorses a healthy diet. Breakfast: Fruit, cereal, eggs Lunch: Fruit, breakfast food, yogurt, nuts, soup Dinner: Meat, vegetable, beans, restaurants, pasta Snacks: Occasionally Desserts: Occasionally, ice cream Beverages: Coffee, water  Exercise: She does not currently exercise Eye exam: Completed in 2017, no changes in vision Dental exam: Completes semi-annually. Colonoscopy: Completed in 2017, polyps, due in 5 years Dexa: Completed in 2014, osteopenia. Currently participating in a study. Pap Smear: Completed in 2017, normal Mammogram: Completed in 2017   Review of Systems  Constitutional: Negative for unexpected weight change.  HENT: Negative for rhinorrhea.   Respiratory: Negative for cough and shortness of breath.   Cardiovascular: Negative for chest pain.  Gastrointestinal: Negative for constipation and diarrhea.  Genitourinary: Negative for difficulty urinating and menstrual problem.       History of intermittent stress incontinence, did Kegel exercises. She's noticed a leakage now with running/walking that may occur after she uses the bathroom. This occurs 2-3 times weekly.   Musculoskeletal: Negative for arthralgias and myalgias.  Skin: Negative for rash.  Allergic/Immunologic: Negative for environmental allergies.  Neurological: Negative for dizziness, numbness and headaches.  Psychiatric/Behavioral:       Feels well managed per psychiatry       Past Medical History:  Diagnosis Date  . ADHD (attention deficit hyperactivity disorder)   . Anemia   . Chicken pox   . Depression   . Essential tremor   . Hearing loss    follows with audologist  . IBS (irritable bowel syndrome)   . Insomnia     . Leucopenia    has blood draw every 5 years  . Measles   . Mitral valve prolapse   . PVC (premature ventricular contraction)      Social History   Social History  . Marital status: Single    Spouse name: N/A  . Number of children: N/A  . Years of education: N/A   Occupational History  . Not on file.   Social History Main Topics  . Smoking status: Never Smoker  . Smokeless tobacco: Not on file  . Alcohol use 0.0 oz/week     Comment: occ  . Drug use: Unknown  . Sexual activity: Not on file   Other Topics Concern  . Not on file   Social History Narrative   Single, has a partner.   Works as a Professor of Physical Therapy.   Works at [employer].   Enjoys photography, travel, being outdoors, relaxing.    Past Surgical History:  Procedure Laterality Date  . COLONOSCOPY WITH PROPOFOL N/A 01/04/2016   Procedure: COLONOSCOPY WITH PROPOFOL;  Surgeon: Manya Silvas, MD;  Location: [facility] ENDOSCOPY;  Service: Endoscopy;  Laterality: N/A;  . INGUINAL HERNIA REPAIR  1984  . KNEE ARTHROSCOPY Right 1993    Family History  Problem Relation Age of Onset  . Hypertension Father   . Macular degeneration Father   . Heart disease Father   . Melanoma Mother   . Anuerysm Mother     Abdominal  . Emphysema Mother   . Emphysema Father   .       No Known Allergies  Current Outpatient Prescriptions on File Prior to Visit  Medication Sig Dispense  Refill  . buPROPion (WELLBUTRIN SR) 150 MG 12 hr tablet Take 300 mg by mouth 2 (two) times daily.    . Cholecalciferol (VITAMIN D3) 2000 UNITS capsule Take 2,000 Units by mouth every other day.     . DULoxetine (CYMBALTA) 60 MG capsule Take 60 mg by mouth daily.   0  . zaleplon (SONATA) 10 MG capsule Take by mouth.     No current facility-administered medications on file prior to visit.     BP 118/78   Pulse 82   Temp 98.3 F (36.8 C) (Oral)   Ht 5\' 9"  (1.753 m)   Wt 187 lb (84.8 kg)   SpO2 98%   BMI 27.62 kg/m    Objective:    Physical Exam  Constitutional: She is oriented to person, place, and time. She appears well-nourished.  HENT:  Right Ear: Tympanic membrane and ear canal normal.  Left Ear: Tympanic membrane and ear canal normal.  Nose: Nose normal.  Mouth/Throat: Oropharynx is clear and moist.  Eyes: Conjunctivae and EOM are normal. Pupils are equal, round, and reactive to light.  Neck: Neck supple. No thyromegaly present.  Cardiovascular: Normal rate and regular rhythm.   No murmur heard. Pulmonary/Chest: Effort normal and breath sounds normal. She has no rales.  Abdominal: Soft. Bowel sounds are normal. There is no tenderness.  Musculoskeletal: Normal range of motion.  Lymphadenopathy:    She has no cervical adenopathy.  Neurological: She is alert and oriented to person, place, and time. She has normal reflexes. No cranial nerve deficit.  Skin: Skin is warm and dry. No rash noted.  Psychiatric: She has a normal mood and affect.          Assessment & Plan:

## 2016-06-28 NOTE — Assessment & Plan Note (Signed)
Stable, follows with psychiatry. Feels well managed.

## 2016-07-07 ENCOUNTER — Telehealth: Payer: Self-pay | Admitting: Primary Care

## 2016-07-07 DIAGNOSIS — E785 Hyperlipidemia, unspecified: Secondary | ICD-10-CM

## 2016-07-07 NOTE — Telephone Encounter (Signed)
Please notify patient that I received her lab results from [facility]:  Liver, kidneys, blood sugar, thyroid function, vitamin D, and electrolytes are all normal.  Cholesterol: Your cholesterol levels are too high. Improvement in your diet and regular exercise will help to lower these levels. Reduce fast food, fried food, processed carbohydrates (chips, cookies, etc), sugary drinks. Increase consumption of fresh fruits and vegetables, whole grains, water. Exercise will also lower these levels.  Please schedule her for repeat lipids in 6 months. Lab only appointment, fasting for 4 hours.

## 2016-07-08 ENCOUNTER — Encounter: Payer: Self-pay | Admitting: *Deleted

## 2016-07-08 NOTE — Telephone Encounter (Signed)
Letter mailed notifying patient. 

## 2016-08-27 ENCOUNTER — Encounter: Payer: Self-pay | Admitting: Physical Therapy

## 2016-08-27 ENCOUNTER — Ambulatory Visit: Payer: BLUE CROSS/BLUE SHIELD | Attending: Primary Care | Admitting: Physical Therapy

## 2016-08-27 DIAGNOSIS — M791 Myalgia, unspecified site: Secondary | ICD-10-CM

## 2016-08-27 DIAGNOSIS — R278 Other lack of coordination: Secondary | ICD-10-CM | POA: Diagnosis present

## 2016-08-27 NOTE — Therapy (Addendum)
[place] MAIN [facility] SERVICES [address] [place], [place], [address] Phone: [phone]   Fax:  [phone]  Physical Therapy Evaluation  Patient Details  Name: Karen Prince MRN: [id number] Date of Birth: 1956-07-13 No Data Recorded  Encounter Date: 08/27/2016      PT End of Session - 09/01/16 1947    Visit Number 1   Number of Visits 12   Date for PT Re-Evaluation 11/20/16   PT Start Time 0915   PT Stop Time [id number]   PT Time Calculation (min) 60 min   Activity Tolerance Patient tolerated treatment well;No increased pain   Behavior During Therapy WFL for tasks assessed/performed      Past Medical History:  Diagnosis Date  . ADHD (attention deficit hyperactivity disorder)   . Anemia   . Chicken pox   . Depression   . Essential tremor   . Hearing loss    follows with audologist  . IBS (irritable bowel syndrome)   . Insomnia   . Leucopenia    has blood draw every 5 years  . Measles   . Mitral valve prolapse   . PVC (premature ventricular contraction)     Past Surgical History:  Procedure Laterality Date  . COLONOSCOPY WITH PROPOFOL N/A 01/04/2016   Procedure: COLONOSCOPY WITH PROPOFOL;  Surgeon: Manya Silvas, MD;  [facility]: [facility] ENDOSCOPY;  Service: Endoscopy;  Laterality: N/A;  . INGUINAL HERNIA REPAIR Right 1984   surgery performed to remove endometrial mass and hernai was found secondary  . KNEE ARTHROSCOPY Right 1993    There were no vitals filed for this visit.       Subjective Assessment - 09/01/16 1940    Subjective 1) Mixed incontinence: Pt reports urinary incontinence started March-April 2017 with a UTI. Pt performed her kegels but she was not able to sense when she was contracting. Pt now experiences leakage after urination and has not sensed the leakage until afterwards. This sensation has improved by 70% and she is able to sense that it might happen before it happens. Pt still unable to contract her  pelvic floor to prevent the leakage. Pt has been performing her Kegels by stopping her urine midstream while sitting on the toilet. Pt has not had any more UTIs since that episode.  Pt also has SUI with coughing. Urge incontinence occurs sometimes. Frequency : 2x / 2 hrs.  Her exercse routine: heel raise, foot intrinsics, jumping, sit to stands, squat, TrA core exercises. No crunches nor sit ups.  Daily fluid intake: 2 -6 cups of coffee, 12 cups of water.  Currently pt only wears pads with activity, travel, or basketball games with yelling and jumping.  2) constipation that has been a pattern all of her life. Bowel movements occur 1-3 days. Bristol Stool Type 75% 4 , 25% Type 1.  3) sleeping disturbances: getting to sleep or unable to get back to sleep. Nocturia: 1-4x/ night.  Pt takes sleeping aids. Pt 's sleep study was neg. Pt takes 3 glasses of water after 6 pm, 1 decaf of coffe at 8pm. Pt goes to bed 11am. Wind down routine sometimes helps (play a game on ipad, deep breathing). Pt has tried mindfulness techniques as she learned with her therapist, but she responds better to focusing one object versus body  scan.           Pertinent History ADHD, increased caffiene intake, limited education on kegel exercises, inguinal hernia repair 1984 on the R 2/2  removal of endometrial mass, knee arthroscopy on R, IBS, mitral valve, no pregnancies. IBS, GERD (Sx occur sporadically) , Hx of bladder infections   Patient Stated Goals  get rid of surprise leakage, get exercising without worry / concern to wear a pad             [facility] PT Assessment - 09/01/16 1940      Balance Screen   Has the patient fallen in the past 6 months Yes  dislocated finger    How many times? 1   Has the patient had a decrease in activity level because of a fear of falling?  No   Is the patient reluctant to leave their home because of a fear of falling?  No     Observation/Other Assessments   Other Surveys  --  ZUNG anxiety    ,  PFDI  , PSQI        Coordination   Gross Motor Movements are Fluid and Coordinated --  diaphragmatic exursion (ant/post dominant)    Fine Motor Movements are Fluid and Coordinated --  abdominal straining w/ cue for BM      Posture/Postural Control   Posture Comments lumbopelvic pertubations w/ leg movements     Palpation   SI assessment  L ASIS more posterior      Bed Mobility   Bed Mobility --  crunch method, OOB                 Pelvic Floor Special Questions - 09/01/16 2003    Pelvic Floor Internal Exam pt consented verbally without contraindications   Exam Type Vaginal   Palpation increased tenderness/ tensions at L obt int and 2nd layer           [facility] Adult PT Treatment/Exercise - 09/01/16 2006      Bed Mobility   Bed Mobility --  crunch method, OOB     Posture/Postural Control   Posture Comments lumbopelvic pertubations w/ leg movements                PT Education - 09/01/16 1947    Education provided Yes   Education Details antaomy/physiology, goals, HEP, POC, proper kegels, overactivity of pelvic floor impacting her Sx   Person(s) Educated Patient   Methods Explanation;Demonstration;Tactile cues;Verbal cues;Handout   Comprehension Returned demonstration;Verbalized understanding             PT Long Term Goals - 08/27/16 0933      PT LONG TERM GOAL #1   Title Pt will decrease her PFDI score from  39%   to < 34 % in order to improve QOL and pelvic floor function   Time 12   Period Weeks   Status New     PT LONG TERM GOAL #2   Title Pt will decrease her ZUNG anxiety score from 51% to <46% in order to decrease urinary frequency and pelvic floor overacitvity.    Time 12   Period Weeks   Status New     PT LONG TERM GOAL #3   Title Pt will decrease her PSQI score from 43% to < 48% in order to demo improved sleep quality and less nocturia episodes   Time 12   Period Weeks   Status New     PT LONG TERM GOAL #4   Title Pt will  demo no pelvic floor tensions/tenderness across 2 visits and demo  improved pelvic floor excursion in order to improve rinary and bowel function.  Time 12   Period Weeks   Status New               Plan - 09/01/16 2006    Clinical Impression Statement Pt is a 61 yo female who c/o mixed incontinence, constipation, and trouble with sleeping. These deficits impact her QOL and ADLs. Pt 's clinical presentations include overactivity of her pelvic floor mm, performance of improper technique with kegels, pelvic obliquities, dyscoordinatino of deep core mm, and limited education on proper technique to fitness exercises that minimize pelvic floor strain.      Rehab Potential Good   Clinical Impairments Affecting Rehab Potential ADHD, increased caffiene intake, limited education on kegel exercises, inguinal hernia repair 1984 on the R 2/2 removal of endometrial mass, knee arthroscopy on R, IBS, mitral valve, IBS, GERD (Sx occur sporadically) , Hx of bladder infections   PT Frequency 1x / week   PT Duration 12 weeks   PT Treatment/Interventions ADLs/Self Care Home Management;Moist Heat;Electrical Stimulation;Functional mobility training;Therapeutic activities;Therapeutic exercise;Balance training;Patient/family education;Neuromuscular re-education;Scar mobilization;Manual techniques;Energy conservation;Taping;Gait training;Stair training;Manual lymph drainage      Patient will benefit from skilled therapeutic intervention in order to improve the following deficits and impairments:  Increased muscle spasms, Decreased activity tolerance, Decreased coordination, Hypermobility, Postural dysfunction, Pain, Decreased strength, Increased fascial restricitons, Decreased range of motion, Decreased mobility, Decreased endurance, Hypomobility, Difficulty walking, Decreased scar mobility, Improper body mechanics  Visit Diagnosis: Myalgia  Other lack of coordination     Problem List Patient Active  Problem List   Diagnosis Date Noted  . Urinary incontinence 06/28/2016  . Preventative health care 06/28/2016  . Insomnia 10/26/2015  . Depression 04/28/2015  . Attention deficit hyperactivity disorder 04/28/2015    Jerl Mina ,PT, DPT, E-RYT  09/01/2016, 8:12 PM  [place] MAIN [facility] SERVICES [address] [place], [place], [address] Phone: [phone]   Fax:  [phone]  Name: Cathey Battle MRN: [id number] Date of Birth: 1956/01/05

## 2016-09-01 NOTE — Addendum Note (Signed)
Addended by: Jerl Mina on: 09/01/2016 08:14 PM   Modules accepted: Orders

## 2016-09-01 NOTE — Patient Instructions (Signed)
You are now ready to begin training the deep core muscles system: diaphragm, transverse abdominis, pelvic floor . These muscles must work together as a team.           The key to these exercises to train the brain to coordinate the timing of these muscles and to have them turn on for long periods of time to hold you upright against gravity (especially important if you are on your feet all day).These muscles are postural muscles and play a role stabilizing your spine and bodyweight. By doing these repetitions slowly and correctly instead of doing crunches, you will achieve a flatter belly without a lower pooch. You are also placing your spine in a more neutral position and breathing properly which in turn, decreases your risk for problems related to your pelvic floor, abdominal, and low back such as pelvic organ prolapse, hernias, diastasis recti (separation of superficial muscles), disk herniations, spinal fractures. These exercises set a solid foundation for you to later progress to resistance/ strength training with therabands and weights and return to other typical fitness exercises with a stronger deeper core.   Do level 1 : 10 reps Do level 2: 10 reps (left and right = 1 rep) x 3 sets , 2 x day Do not progress to level 3 for 3-4 weeks. You know you are ready when you do not have any rocking of pelvis nor arching in your back    Discontinue from performing kegels while sitting on the toilet and stopping the flow of urine  Lengthen pelvic floor w/ inhalation for elimination of bowels instead of abdominal straining

## 2016-09-05 ENCOUNTER — Ambulatory Visit: Payer: BLUE CROSS/BLUE SHIELD | Attending: Primary Care | Admitting: Physical Therapy

## 2016-09-05 DIAGNOSIS — R278 Other lack of coordination: Secondary | ICD-10-CM | POA: Diagnosis present

## 2016-09-05 DIAGNOSIS — M791 Myalgia, unspecified site: Secondary | ICD-10-CM

## 2016-09-05 NOTE — Patient Instructions (Signed)
Lengthening pelvic floor  Focus on reverse kegel: think lowering pelvic floor to basement   PRONE  Frog stretch: laying on belly with pillow under hips, knees bent, inhale do nothing, exhale let ankles fall apart ~4 10 reps x 3     Rock n roll --- inhale, expand pelvic floor , hips back,  Exhale rock forward  5x     Rest in quadriped onto pillows 10 min  (restorative pose)   Butterfly with knees onto pillows ( restorative pose)  10 min  ______  Functional: during sitting breaks  Mini squat with inhale to expand pelvic floor, exhale to stand    Slight pelvic tilts (seated or hooklying)    ______

## 2016-09-07 NOTE — Therapy (Addendum)
[place] MAIN [facility] SERVICES [address] [place], [place], [address] Phone: [phone]   Fax:  [phone]  Physical Therapy Treatment  Patient Details  Name: Karen Prince MRN: [id number] Date of Birth: 06/08/1956 No Data Recorded  Encounter Date: 09/05/2016      PT End of Session - 09/07/16 0217    Visit Number 2   Number of Visits 12   Date for PT Re-Evaluation 11/20/16   PT Start Time [id number]   PT Stop Time 1700   PT Time Calculation (min) 55 min   Activity Tolerance Patient tolerated treatment well;No increased pain   Behavior During Therapy WFL for tasks assessed/performed      Past Medical History:  Diagnosis Date  . ADHD (attention deficit hyperactivity disorder)   . Anemia   . Chicken pox   . Depression   . Essential tremor   . Hearing loss    follows with audologist  . IBS (irritable bowel syndrome)   . Insomnia   . Leucopenia    has blood draw every 5 years  . Measles   . Mitral valve prolapse   . PVC (premature ventricular contraction)     Past Surgical History:  Procedure Laterality Date  . COLONOSCOPY WITH PROPOFOL N/A 01/04/2016   Procedure: COLONOSCOPY WITH PROPOFOL;  Surgeon: Manya Silvas, MD;  Location[facility] ENDOSCOPY;  Service: Endoscopy;  Laterality: N/A;  . INGUINAL HERNIA REPAIR Right 1984   surgery performed to remove endometrial mass and hernai was found secondary  . KNEE ARTHROSCOPY Right 1993    There were no vitals filed for this visit.      Subjective Assessment - 09/07/16 0219    Subjective Pt reported she noticed not to push down on her pelvic floor mm after understanding the deep core coordination.             [facility] PT Assessment - 09/07/16 0207      ROM / Strength   AROM / PROM / Strength --  hip abd/ER on L w/ BUE assist pre Tx,single UE assist postTx                  Pelvic Floor Special Questions - 09/07/16 0206    Pelvic Floor Internal Exam pt consented  verbally without contraindications   Exam Type Vaginal   Palpation increased tenderness/ tensions B obt internus  , less tensions at 2nd layer                         PT Long Term Goals - 08/27/16 0933      PT LONG TERM GOAL #1   Title Pt will decrease her PFDI score from  39%   to < 34 % in order to improve QOL and pelvic floor function   Time 12   Period Weeks   Status New     PT LONG TERM GOAL #2   Title Pt will decrease her ZUNG anxiety score from 51% to <46% in order to decrease urinary frequency and pelvic floor overacitvity.    Time 12   Period Weeks   Status New     PT LONG TERM GOAL #3   Title Pt will decrease her PSQI score from 43% to < 48% in order to demo improved sleep quality and less nocturia episodes   Time 12   Period Weeks   Status New     PT LONG TERM GOAL #4  Title Pt will demo no pelvic floor tensions/tenderness across 2 visits and demo  improved pelvic floor excursion in order to improve rinary and bowel function.    Time 12   Period Weeks   Status New               Plan - 09/07/16 0208    Clinical Impression Statement Pt demo'd decreased pelvic floor mm tensions at 1-2nd layers but continued to have tensions at B obturator internus. Pt showed decreased tensions and tensions following Tx and demo'd increased hip ROM with hip ER/abd in seated position for donning shoes. Plan to also address pt's spinal mm to increased hip mobility and to also modify her self-selected exercises.  Pt continues to benefit from skilled PT.    Rehab Potential Good   Clinical Impairments Affecting Rehab Potential ADHD, increased caffiene intake, limited education on kegel exercises, inguinal hernia repair 1984 on the R 2/2 removal of endometrial mass, knee arthroscopy on R, IBS, mitral valve, IBS, GERD (Sx occur sporadically) , Hx of bladder infections   PT Frequency 1x / week   PT Duration 12 weeks   PT Treatment/Interventions ADLs/Self Care Home  Management;Moist Heat;Electrical Stimulation;Functional mobility training;Therapeutic activities;Therapeutic exercise;Balance training;Patient/family education;Neuromuscular re-education;Scar mobilization;Manual techniques;Energy conservation;Taping;Gait training;Stair training;Manual lymph drainage      Patient will benefit from skilled therapeutic intervention in order to improve the following deficits and impairments:  Increased muscle spasms, Decreased activity tolerance, Decreased coordination, Hypermobility, Postural dysfunction, Pain, Decreased strength, Increased fascial restricitons, Decreased range of motion, Decreased mobility, Decreased endurance, Hypomobility, Difficulty walking, Decreased scar mobility, Improper body mechanics  Visit Diagnosis: Myalgia Other lack of coordination       Problem List Patient Active Problem List   Diagnosis Date Noted  . Urinary incontinence 06/28/2016  . Preventative health care 06/28/2016  . Insomnia 10/26/2015  . Depression 04/28/2015  . Attention deficit hyperactivity disorder 04/28/2015    Jerl Mina ,PT, DPT, E-RYT  09/07/2016, 2:19 AM  [place] MAIN [facility] SERVICES [address] [place], [place], [address] Phone: [phone]   Fax:  [phone]  Name: Karen Prince MRN: [id number] Date of Birth: 05-Dec-1955

## 2016-09-11 ENCOUNTER — Ambulatory Visit: Payer: BLUE CROSS/BLUE SHIELD | Admitting: Physical Therapy

## 2016-09-11 DIAGNOSIS — R278 Other lack of coordination: Secondary | ICD-10-CM

## 2016-09-11 DIAGNOSIS — M791 Myalgia, unspecified site: Secondary | ICD-10-CM

## 2016-09-11 NOTE — Patient Instructions (Signed)
Feet care :  Self -feet massage   Handshake : fingers between toes, moving ballmounds/toes back and forth several times while other hand anchors at arch. Do the same at the hind/mid foot.  Heel to toes upward to a letter Big Letter T strokes to spread ballmounds and toes, several times, pinch between webs of toes  Run finger tips along top of foot between long bones      stretches after typing/driving  1. Hand/ wrists Cross R wrist over Clasp hands, thumbs down  Bend elbows, circle wrists and fingers towards heart and then up towards chin  Reverse movements 5 reps       2.  Interlace palms behind back , thumbs close to back  Pull hands downward , straightening elbows, shoulder blades squeezing towards each other Open thumbs apart, spread them apart inside of palms face the ground 5 reps      3. Prayer hands for finger extension   4. Palm circles against back of a chair    5. Sit to stand  --> interlace hands behind back, draw shoulder down and back  -> flip the palms --> scoot knees forward, hips under and chest lift as you stand      6. Pat shoulder  ---> side flexion     7. Shake wrists , arms relaxed   _____  Warrior I : Feet are hip width apart, one behind like you are on ski tracks, front knee bent over ankle but not more forward then the ankle.  Make sure 50% weight is in the front foot/leg , 50% weight is the back foot/ leg   Arms up like "V" , drop shoulders down  Palms together at the hears   Warrior II:  Raise arm up in front on the same side as your front knee, back arm up behind     Extended side angle :  Feet are hip width apart, L foot one behind like you are on ski tracks,  R knee bent over ankle but not more forward then the ankle.  Make sure 50% weight is in the front foot/leg , 50% weight is the back foot/ leg    Rest R forearm lightly on top of thigh,  L hand on L hip.  Inhale lengthen spine,   Exhale turn navel to the L then the  ribcage turns, look at the other wall.  Keep maintaining  50% weight is in the front foot/leg , 50% weight is the back foot/ leg  And make sure the front knee is still pointed in the toe line of the 2nd toe.   3 breaths here.    __________________  SLS stand or minisquat w. Bicep curls with band Emphasis pelvic floor lengtehning on minisquat, exhalation on rise    Sideplank on forearms with quad stretch

## 2016-09-11 NOTE — Therapy (Signed)
[place] MAIN [facility] SERVICES [address] [place], [place], [address] Phone: [phone]   Fax:  [phone]  Physical Therapy Treatment  Patient Details  Name: Karen Prince MRN: [id number] Date of Birth: 09-06-1955 No Data Recorded  Encounter Date: 09/11/2016      PT End of Session - 09/11/16 1054    Visit Number 3   Number of Visits 12   Date for PT Re-Evaluation 11/20/16   PT Start Time 0810   PT Stop Time 0905   PT Time Calculation (min) 55 min   Activity Tolerance Patient tolerated treatment well;No increased pain   Behavior During Therapy WFL for tasks assessed/performed      Past Medical History:  Diagnosis Date  . ADHD (attention deficit hyperactivity disorder)   . Anemia   . Chicken pox   . Depression   . Essential tremor   . Hearing loss    follows with audologist  . IBS (irritable bowel syndrome)   . Insomnia   . Leucopenia    has blood draw every 5 years  . Measles   . Mitral valve prolapse   . PVC (premature ventricular contraction)     Past Surgical History:  Procedure Laterality Date  . COLONOSCOPY WITH PROPOFOL N/A 01/04/2016   Procedure: COLONOSCOPY WITH PROPOFOL;  Surgeon: Manya Silvas, MD;  Location: [facility] ENDOSCOPY;  Service: Endoscopy;  Laterality: N/A;  . INGUINAL HERNIA REPAIR Right 1984   surgery performed to remove endometrial mass and hernai was found secondary  . KNEE ARTHROSCOPY Right 1993    There were no vitals filed for this visit.      Subjective Assessment - 09/11/16 0823    Subjective Pt reports crossing the left leg is getting easily actively.  Pt currently has a respiratory infection and has been coughing/ sneezing. Pt notices less leakage with coughing and sneezing            [facility] PT Assessment - 09/11/16 1049      Other:   Other/ Comments poor disassociation of trunk and pelvis with standing exercise     ROM / Strength   AROM / PROM / Strength --  decreased UE  assistance with L hip ER/ abd                      [facility] Adult PT Treatment/Exercise - 09/11/16 1050      Therapeutic Activites    Therapeutic Activities --  see pt instructions     Neuro Re-ed    Neuro Re-ed Details  see  pt instructions                 PT Education - 09/11/16 1049    Education provided Yes   Education Details HEP   Person(s) Educated Patient   Methods Explanation;Demonstration;Tactile cues;Verbal cues;Handout   Comprehension Returned demonstration;Verbalized understanding             PT Long Term Goals - 09/11/16 0824      PT LONG TERM GOAL #1   Title Pt will decrease her PFDI score from  39%   to < 34 % in order to improve QOL and pelvic floor function   Time 12   Period Weeks   Status On-going     PT LONG TERM GOAL #2   Title Pt will decrease her ZUNG anxiety score from 51% to <46% in order to decrease urinary frequency and pelvic floor overacitvity.  Time 12   Period Weeks   Status On-going     PT LONG TERM GOAL #3   Title Pt will decrease her PSQI score from 43% to < 48% in order to demo improved sleep quality and less nocturia episodes   Time 12   Period Weeks   Status On-going     PT LONG TERM GOAL #4   Title Pt will demo no pelvic floor tensions/tenderness across 2 visits and demo  improved pelvic floor excursion in order to improve rinary and bowel function.    Time 12   Period Weeks   Status On-going               Plan - 09/11/16 1052    Clinical Impression Statement Deferred internal pelvic floor assessment due to pt having a respiratory infection. Pt reports decreased leakage with coughing and sneezing. Pt demo'd increased ROM with hip abd/ER with less assistance of UE, indicating increased flexibility of pelvic floor/ glut mm. Modified pt's self-selected exercises to incorporate pelvic floor lengthening and decrease tensions from her daily activities. Pt continues to benefit from skilled PT.      Rehab Potential Good   Clinical Impairments Affecting Rehab Potential ADHD, increased caffiene intake, limited education on kegel exercises, inguinal hernia repair 1984 on the R 2/2 removal of endometrial mass, knee arthroscopy on R, IBS, mitral valve, IBS, GERD (Sx occur sporadically) , Hx of bladder infections   PT Frequency 1x / week   PT Duration 12 weeks   PT Treatment/Interventions ADLs/Self Care Home Management;Moist Heat;Electrical Stimulation;Functional mobility training;Therapeutic activities;Therapeutic exercise;Balance training;Patient/family education;Neuromuscular re-education;Scar mobilization;Manual techniques;Energy conservation;Taping;Gait training;Stair training;Manual lymph drainage      Patient will benefit from skilled therapeutic intervention in order to improve the following deficits and impairments:  Increased muscle spasms, Decreased activity tolerance, Decreased coordination, Hypermobility, Postural dysfunction, Pain, Decreased strength, Increased fascial restricitons, Decreased range of motion, Decreased mobility, Decreased endurance, Hypomobility, Difficulty walking, Decreased scar mobility, Improper body mechanics  Visit Diagnosis: Myalgia  Other lack of coordination     Problem List Patient Active Problem List   Diagnosis Date Noted  . Urinary incontinence 06/28/2016  . Preventative health care 06/28/2016  . Insomnia 10/26/2015  . Depression 04/28/2015  . Attention deficit hyperactivity disorder 04/28/2015    Jerl Mina ,PT, DPT, E-RYT  09/11/2016, 10:55 AM  [place] MAIN [facility] SERVICES [place], [place], [address] Phone: [phone]   Fax:  [phone]  Name: Karen Prince MRN: [id number] Date of Birth: 1956-02-16

## 2016-09-26 ENCOUNTER — Ambulatory Visit: Payer: BLUE CROSS/BLUE SHIELD | Attending: Primary Care | Admitting: Physical Therapy

## 2016-09-26 DIAGNOSIS — M791 Myalgia, unspecified site: Secondary | ICD-10-CM

## 2016-09-26 DIAGNOSIS — R278 Other lack of coordination: Secondary | ICD-10-CM | POA: Diagnosis present

## 2016-09-26 NOTE — Therapy (Addendum)
[place] MAIN [facility] SERVICES [address] [place], [place], [address] Phone: [phone]   Fax:  [phone]  Physical Therapy Treatment  Patient Details  Name: Karen Prince MRN: [id number] Date of Birth: Aug 30, 1955 No Data Recorded  Encounter Date: 09/26/2016      PT End of Session - 09/26/16 2230    Visit Number 4   Number of Visits 12   Date for PT Re-Evaluation 11/20/16   PT Start Time 1340   PT Stop Time 1435   PT Time Calculation (min) 55 min   Activity Tolerance Patient tolerated treatment well;No increased pain   Behavior During Therapy WFL for tasks assessed/performed      Past Medical History:  Diagnosis Date  . ADHD (attention deficit hyperactivity disorder)   . Anemia   . Chicken pox   . Depression   . Essential tremor   . Hearing loss    follows with audologist  . IBS (irritable bowel syndrome)   . Insomnia   . Leucopenia    has blood draw every 5 years  . Measles   . Mitral valve prolapse   . PVC (premature ventricular contraction)     Past Surgical History:  Procedure Laterality Date  . COLONOSCOPY WITH PROPOFOL N/A 01/04/2016   Procedure: COLONOSCOPY WITH PROPOFOL;  Surgeon: Manya Silvas, MD;  Location: [facility] ENDOSCOPY;  Service: Endoscopy;  Laterality: N/A;  . INGUINAL HERNIA REPAIR Right 1984   surgery performed to remove endometrial mass and hernai was found secondary  . KNEE ARTHROSCOPY Right 1993    There were no vitals filed for this visit.      Subjective Assessment - 09/26/16 1345    Subjective Pt reported 85% improvement with leakage. Pt had minimal leakage with walking at a conference. Pt had minor leakage with coughing. Pt is able to completely to empty urinate better than before. Pt is having trouble with lifting and  crossing L ankle across R thigh.      Pertinent History ADHD, increased caffiene intake, limited education on kegel exercises, inguinal hernia repair 1984 on the R 2/2  removal of endometrial mass, knee arthroscopy on R, IBS, mitral valve, no pregnancies. IBS, GERD (Sx occur sporadically) , Hx of bladder infections   Patient Stated Goals  get rid of surprise leakage, get exercising without worry / concern to wear a pad             [facility] PT Assessment - 09/26/16 1430      Observation/Other Assessments   Observations pre Tx: required BUE to assist to cross L ankle, post Tx: no hands but with momentum     ROM / Strength   AROM / PROM / Strength --  B AROM WFL for hip ext but L with compensation at lumbar lor     Palpation   SI assessment  coccyx deviated to R, increased coccygeus mm tensions R, (post Tx decreased tenderness and tensions)                      [facility] Adult PT Treatment/Exercise - 09/26/16 1433      Therapeutic Activites    Therapeutic Activities --  see pt instructions     Manual Therapy   Manual therapy comments STM and MWM along lateral border of sacrum, coccygx R                      PT Long Term Goals -  09/26/16 1350      PT LONG TERM GOAL #1   Title Pt will decrease her PFDI score from  39%   to < 34 % in order to improve QOL and pelvic floor function   Time 12   Period Weeks   Status On-going     PT LONG TERM GOAL #2   Title Pt will decrease her ZUNG anxiety score from 51% to <46% in order to decrease urinary frequency and pelvic floor overacitvity.    Time 12   Period Weeks   Status On-going     PT LONG TERM GOAL #3   Title Pt will decrease her PSQI score from 43% to < 48% in order to demo improved sleep quality and less nocturia episodes   Time 12   Period Weeks   Status On-going     PT LONG TERM GOAL #4   Title Pt will demo no pelvic floor tensions/tenderness across 2 visits and demo  improved pelvic floor excursion in order to improve rinary and bowel function.    Time 12   Period Weeks   Status On-going     PT LONG TERM GOAL #5   Title Pt will be able actively lift her L leg  and cross ankle over R thigh without compensation in order to demo decreased coccygeus tensions, coccyx deviation to don socks and shoes   Time 12   Period Weeks   Status New               Plan - 09/26/16 2230    Clinical Impression Statement Pt reports 85% improvement with urinary leakage. Addressed pt's difficulty with lifitng and crossing her L ankle over her R thigh by correcting her R deviated coccyx with increased coccygeus mm tensions.  Pt has shown decreased pelvic floor/glut mm tensions across the past visits but will continue to require skilled PT to further address hip/pelvic floor mobility and strength.  Pt reported feeling loose in her L hip with walking following Tx   Rehab Potential Good   Clinical Impairments Affecting Rehab Potential ADHD, increased caffiene intake, limited education on kegel exercises, inguinal hernia repair 1984 on the R 2/2 removal of endometrial mass, knee arthroscopy on R, IBS, mitral valve, IBS, GERD (Sx occur sporadically) , Hx of bladder infections   PT Frequency 1x / week   PT Duration 12 weeks   PT Treatment/Interventions ADLs/Self Care Home Management;Moist Heat;Electrical Stimulation;Functional mobility training;Therapeutic activities;Therapeutic exercise;Balance training;Patient/family education;Neuromuscular re-education;Scar mobilization;Manual techniques;Energy conservation;Taping;Gait training;Stair training;Manual lymph drainage      Patient will benefit from skilled therapeutic intervention in order to improve the following deficits and impairments:  Increased muscle spasms, Decreased activity tolerance, Decreased coordination, Hypermobility, Postural dysfunction, Pain, Decreased strength, Increased fascial restricitons, Decreased range of motion, Decreased mobility, Decreased endurance, Hypomobility, Difficulty walking, Decreased scar mobility, Improper body mechanics  Visit Diagnosis: Myalgia  Other lack of  coordination     Problem List Patient Active Problem List   Diagnosis Date Noted  . Urinary incontinence 06/28/2016  . Preventative health care 06/28/2016  . Insomnia 10/26/2015  . Depression 04/28/2015  . Attention deficit hyperactivity disorder 04/28/2015    Jerl Mina ,PT, DPT, E-RYT  09/26/2016, 10:39 PM  [place] MAIN [facility] SERVICES [address] [place], [place], [address] Phone: [phone]   Fax:  [phone]  Name: Karen Prince MRN: [id number] Date of Birth: Dec 05, 1955

## 2016-09-26 NOTE — Patient Instructions (Signed)
Reverse table -->   Piriformis stretch  -->   Lower hips down slowly -->   Shoulder depression  (scoot opp foot, if stretch is too intense)   5 breaths   ________  Prone single active frog leg 10 reps    _________  Seated AAROM piriformis glide along opposite tibia  "Leg slide" WITH EXHALE co activate pelvic floor with leg lift   When donning /doffing shoes, and seated 5 reps    ___________

## 2016-09-27 ENCOUNTER — Ambulatory Visit: Payer: BLUE CROSS/BLUE SHIELD | Admitting: Physical Therapy

## 2016-10-04 ENCOUNTER — Encounter: Payer: BLUE CROSS/BLUE SHIELD | Admitting: Physical Therapy

## 2016-10-11 ENCOUNTER — Ambulatory Visit: Payer: BLUE CROSS/BLUE SHIELD | Admitting: Physical Therapy

## 2016-10-11 DIAGNOSIS — M791 Myalgia, unspecified site: Secondary | ICD-10-CM

## 2016-10-11 DIAGNOSIS — R278 Other lack of coordination: Secondary | ICD-10-CM

## 2016-10-11 NOTE — Therapy (Signed)
[place] MAIN [facility] SERVICES [address] [place], [place], [address] Phone: [phone]   Fax:  [phone]  Physical Therapy Treatment  Patient Details  Name: Karen Prince MRN: [id number] Date of Birth: Mar 23, 1956 No Data Recorded  Encounter Date: 10/11/2016      PT End of Session - 10/11/16 0902    Visit Number 5   Number of Visits 12   Date for PT Re-Evaluation 11/20/16   PT Start Time 0804   PT Stop Time 0902   PT Time Calculation (min) 58 min   Activity Tolerance Patient tolerated treatment well;No increased pain   Behavior During Therapy WFL for tasks assessed/performed      Past Medical History:  Diagnosis Date  . ADHD (attention deficit hyperactivity disorder)   . Anemia   . Chicken pox   . Depression   . Essential tremor   . Hearing loss    follows with audologist  . IBS (irritable bowel syndrome)   . Insomnia   . Leucopenia    has blood draw every 5 years  . Measles   . Mitral valve prolapse   . PVC (premature ventricular contraction)     Past Surgical History:  Procedure Laterality Date  . COLONOSCOPY WITH PROPOFOL N/A 01/04/2016   Procedure: COLONOSCOPY WITH PROPOFOL;  Surgeon: Manya Silvas, MD;  Location: [facility] ENDOSCOPY;  Service: Endoscopy;  Laterality: N/A;  . INGUINAL HERNIA REPAIR Right 1984   surgery performed to remove endometrial mass and hernai was found secondary  . KNEE ARTHROSCOPY Right 1993    There were no vitals filed for this visit.      Subjective Assessment - 10/11/16 0853    Subjective Pt reports she has had minimal leakage when yelling/ cheering at basketball games. Pt has been able to corss her L ankle over R thigh but has to lean back    Pertinent History ADHD, increased caffiene intake, limited education on kegel exercises, inguinal hernia repair 1984 on the R 2/2 removal of endometrial mass, knee arthroscopy on R, IBS, mitral valve, no pregnancies. IBS, GERD (Sx occur  sporadically) , Hx of bladder infections   Patient Stated Goals  get rid of surprise leakage, get exercising without worry / concern to wear a pad             [facility] PT Assessment - 10/11/16 0854      Observation/Other Assessments   Observations pre Tx: pt with posterior trunk lean to cross L ankle over R, Post Tx : no change     Palpation   Spinal mobility hypomobility at T3, T7-10, increased interspinal mm L > R along thoracic regions (decreased mm tensions and hypomobility postTx)                   Pelvic Floor Special Questions - 10/11/16 0854    Pelvic Floor Internal Exam pt consented verbally without contraindications   Exam Type Vaginal   Palpation increased mm burning sensation at R obt int, increased tensions at iliococcygeus B (decreased post Tx )           [facility] Adult PT Treatment/Exercise - 10/11/16 8099      Therapeutic Activites    Therapeutic Activities --  see pt instructions     Neuro Re-ed    Neuro Re-ed Details  see  pt instructions      Manual Therapy   Manual therapy comments Grade II-III mobs along problem area nioted in assessment, STM  along mm tensions    Internal Pelvic Floor thiele massage, fascial release at problem areas indicated in assessment                 PT Education - 10/11/16 0902    Education provided Yes   Education Details HEP   Person(s) Educated Patient   Methods Explanation;Demonstration;Tactile cues;Verbal cues;Handout   Comprehension Returned demonstration;Verbalized understanding             PT Long Term Goals - 10/11/16 0912      PT LONG TERM GOAL #1   Title Pt will decrease her PFDI score from  39%   to < 34 % in order to improve QOL and pelvic floor function   Time 12   Period Weeks   Status On-going     PT LONG TERM GOAL #2   Title Pt will decrease her ZUNG anxiety score from 51% to <46% in order to decrease urinary frequency and pelvic floor overacitvity.    Time 12   Period Weeks    Status On-going     PT LONG TERM GOAL #3   Title Pt will decrease her PSQI score from 43% to < 48% in order to demo improved sleep quality and less nocturia episodes   Time 12   Period Weeks   Status On-going     PT LONG TERM GOAL #4   Title Pt will demo no pelvic floor tensions/tenderness across 2 visits and demo  improved pelvic floor excursion in order to improve rinary and bowel function.    Time 12   Period Weeks   Status On-going     PT LONG TERM GOAL #5   Title Pt will be able actively lift her L leg and cross ankle over R thigh without compensation in order to demo decreased coccygeus tensions, coccyx deviation to don socks and shoes   Time 12   Period Weeks   Status On-going               Plan - 10/11/16 0903    Clinical Impression Statement Pt reports she has had decreased leakage with yelling / screaming at basketball games which she enjoys. Pt has improved with hip ROM with no need to lift with UE to place ankles over thighs to don shoes but still requires a posterior trunk lean. Pt demo'd decreased posterior pelvic floor tensions and increased thoracic mobility following Tx.  Plan to address anterior groin and hip to help pt achieve increased ability to cross her ankles over thighs for donning shoes without posterior trunk lean. Pt plans to return HEP with less travel in the upcoming weeks. Pt will continue to benefit from skilled PT.     Rehab Potential Good   Clinical Impairments Affecting Rehab Potential ADHD, increased caffiene intake, limited education on kegel exercises, inguinal hernia repair 1984 on the R 2/2 removal of endometrial mass, knee arthroscopy on R, IBS, mitral valve, IBS, GERD (Sx occur sporadically) , Hx of bladder infections   PT Frequency 1x / week   PT Duration 12 weeks   PT Treatment/Interventions ADLs/Self Care Home Management;Moist Heat;Electrical Stimulation;Functional mobility training;Therapeutic activities;Therapeutic exercise;Balance  training;Patient/family education;Neuromuscular re-education;Scar mobilization;Manual techniques;Energy conservation;Taping;Gait training;Stair training;Manual lymph drainage      Patient will benefit from skilled therapeutic intervention in order to improve the following deficits and impairments:  Increased muscle spasms, Decreased activity tolerance, Decreased coordination, Hypermobility, Postural dysfunction, Pain, Decreased strength, Increased fascial restricitons, Decreased range of motion, Decreased mobility, Decreased endurance,  Hypomobility, Difficulty walking, Decreased scar mobility, Improper body mechanics  Visit Diagnosis: Myalgia  Other lack of coordination     Problem List Patient Active Problem List   Diagnosis Date Noted  . Urinary incontinence 06/28/2016  . Preventative health care 06/28/2016  . Insomnia 10/26/2015  . Depression 04/28/2015  . Attention deficit hyperactivity disorder 04/28/2015    Jerl Mina ,PT, DPT, E-RYT  10/11/2016, 9:13 AM  [place] MAIN [facility] SERVICES [address] [place], [place], [address] Phone: [phone]   Fax:  [phone]  Name: Karen Prince MRN: [id number] Date of Birth: 1955-12-31

## 2016-10-11 NOTE — Patient Instructions (Signed)
Focus on releasing global mm to relax them first   To release iliococcygeus ( slight pelvic tilts) in deep core level 1    Replace deep core level 2 with level 3  Put all four corners of lower ribs and PSIS contacting bed   ______    To release midback pain/ muscle tensions:  __ Yoga block (2") behind midback longways , a block under head   Reposition into lengthen spine , pelvic neutral  Allow knees to move ~10-15 deg so to allow your body weight to hang over edge of the block and back to center, then the other side  10 min here.    __ thread the needle from quadriped, blocks under L shoulder and side of L temple    5 breaths here. Make sure neck is secure not twerked     Increase thoracic extension   Mini low cobras:  Toes turned each other palms under armpit, elbows by ribs (like cricket wings),  imaginary pencil held in armpit,   Inhale,  Exhale press through palms to bed/floor and small lift of chest only not the chin. You will feel the bend in the midback not your low back   5 reps

## 2016-10-16 ENCOUNTER — Ambulatory Visit: Payer: BLUE CROSS/BLUE SHIELD | Admitting: Physical Therapy

## 2016-10-16 DIAGNOSIS — M791 Myalgia, unspecified site: Secondary | ICD-10-CM

## 2016-10-16 DIAGNOSIS — R278 Other lack of coordination: Secondary | ICD-10-CM

## 2016-10-16 NOTE — Patient Instructions (Signed)
For decreasing sartoris attachment tensions   Strap on ballmounds, toes spread  R knee and foot stable, both PSIS are levelled  Hip flexion is at 60 from table  Hip add/ abd ~15 deg from midline     Happy Baby pose with strap    dolphin plank with knees out, medial foot on bed at 90 deg   hip abd/ hip ER in comfortable degree  5-10 rep

## 2016-10-16 NOTE — Therapy (Signed)
[place] MAIN [facility] SERVICES [address] [place], [place], [address] Phone: [phone]   Fax:  [phone]  Physical Therapy Treatment  Patient Details  Name: Karen Prince MRN: [id number] Date of Birth: 12-04-1955 No Data Recorded  Encounter Date: 10/16/2016      PT End of Session - 10/16/16 0935    Visit Number 6   Number of Visits 12   Date for PT Re-Evaluation 11/20/16   PT Start Time 0802   PT Stop Time 0903   PT Time Calculation (min) 61 min   Activity Tolerance Patient tolerated treatment well;No increased pain   Behavior During Therapy WFL for tasks assessed/performed      Past Medical History:  Diagnosis Date  . ADHD (attention deficit hyperactivity disorder)   . Anemia   . Chicken pox   . Depression   . Essential tremor   . Hearing loss    follows with audologist  . IBS (irritable bowel syndrome)   . Insomnia   . Leucopenia    has blood draw every 5 years  . Measles   . Mitral valve prolapse   . PVC (premature ventricular contraction)     Past Surgical History:  Procedure Laterality Date  . COLONOSCOPY WITH PROPOFOL N/A 01/04/2016   Procedure: COLONOSCOPY WITH PROPOFOL;  Surgeon: Manya Silvas, MD;  Location: [facility] ENDOSCOPY;  Service: Endoscopy;  Laterality: N/A;  . INGUINAL HERNIA REPAIR Right 1984   surgery performed to remove endometrial mass and hernai was found secondary  . KNEE ARTHROSCOPY Right 1993    There were no vitals filed for this visit.      Subjective Assessment - 10/16/16 0903    Subjective Pt reported she has noticed less low back arch when laying on her back, and increased hip mobility with her new HEP.    Pertinent History ADHD, increased caffiene intake, limited education on kegel exercises, inguinal hernia repair 1984 on the R 2/2 removal of endometrial mass, knee arthroscopy on R, IBS, mitral valve, no pregnancies. IBS, GERD (Sx occur sporadically) , Hx of bladder infections   Patient Stated Goals  get rid of surprise leakage, get exercising without worry / concern to wear a pad             [facility] PT Assessment - 10/16/16 0929      Observation/Other Assessments   Observations post Tx: less difficulty with cue for rolling hip joint into hip abd/ ER more before propping ankle over R thigh     Palpation   SI assessment  R ASIS more anterior , elevated, L pubic symphysis more inferior than L    Palpation comment increased tensions. tenderness along L pubic tubercle, L satoris attachments distal and proximially)   decreased post Tx                     [facility] Adult PT Treatment/Exercise - 10/16/16 0932      Therapeutic Activites    Therapeutic Activities --  see pt instructions     Neuro Re-ed    Neuro Re-ed Details  see  pt instructions      Manual Therapy   Internal Pelvic Floor long axis distaction B, AP mob at R hip, sidelying L for rotational mob of R, STM. fascial release along L pubic tubercle and sartoris attachments,  manual lymph drainage over abdominal. BLE  prior to this sequence  PT Education - 10/16/16 0934    Education provided Yes   Education Details HEP   Person(s) Educated Patient   Methods Explanation;Demonstration;Tactile cues;Verbal cues;Handout   Comprehension Returned demonstration;Verbalized understanding             PT Long Term Goals - 10/11/16 0912      PT LONG TERM GOAL #1   Title Pt will decrease her PFDI score from  39%   to < 34 % in order to improve QOL and pelvic floor function   Time 12   Period Weeks   Status On-going     PT LONG TERM GOAL #2   Title Pt will decrease her ZUNG anxiety score from 51% to <46% in order to decrease urinary frequency and pelvic floor overacitvity.    Time 12   Period Weeks   Status On-going     PT LONG TERM GOAL #3   Title Pt will decrease her PSQI score from 43% to < 48% in order to demo improved sleep quality and less nocturia episodes    Time 12   Period Weeks   Status On-going     PT LONG TERM GOAL #4   Title Pt will demo no pelvic floor tensions/tenderness across 2 visits and demo  improved pelvic floor excursion in order to improve rinary and bowel function.    Time 12   Period Weeks   Status On-going     PT LONG TERM GOAL #5   Title Pt will be able actively lift her L leg and cross ankle over R thigh without compensation in order to demo decreased coccygeus tensions, coccyx deviation to don socks and shoes   Time 12   Period Weeks   Status On-going               Plan - 10/16/16 0935    Clinical Impression Statement Pt demo'd decreased tensions at sartoris attachments proximally following Tx which enabled pt to cross ankle over opposite thigh but still less trunk lean and less effort. Pt also demo'd more symmetrically aligned pubic symphysis and pelvic girdle following manual Tx as well.  Pt continues to benefit from skilled PT and is progressing well towards her goals.    Rehab Potential Good   Clinical Impairments Affecting Rehab Potential ADHD, increased caffiene intake, limited education on kegel exercises, inguinal hernia repair 1984 on the R 2/2 removal of endometrial mass, knee arthroscopy on R, IBS, mitral valve, IBS, GERD (Sx occur sporadically) , Hx of bladder infections   PT Frequency 1x / week   PT Duration 12 weeks   PT Treatment/Interventions ADLs/Self Care Home Management;Moist Heat;Electrical Stimulation;Functional mobility training;Therapeutic activities;Therapeutic exercise;Balance training;Patient/family education;Neuromuscular re-education;Scar mobilization;Manual techniques;Energy conservation;Taping;Gait training;Stair training;Manual lymph drainage      Patient will benefit from skilled therapeutic intervention in order to improve the following deficits and impairments:  Increased muscle spasms, Decreased activity tolerance, Decreased coordination, Hypermobility, Postural dysfunction,  Pain, Decreased strength, Increased fascial restricitons, Decreased range of motion, Decreased mobility, Decreased endurance, Hypomobility, Difficulty walking, Decreased scar mobility, Improper body mechanics  Visit Diagnosis: Myalgia  Other lack of coordination     Problem List Patient Active Problem List   Diagnosis Date Noted  . Urinary incontinence 06/28/2016  . Preventative health care 06/28/2016  . Insomnia 10/26/2015  . Depression 04/28/2015  . Attention deficit hyperactivity disorder 04/28/2015    Jerl Mina ,PT, DPT, E-RYT  10/16/2016, 9:59 AM  [place] MAIN [facility] SERVICES [address]  [place], [place], [address] Phone: [phone]   Fax:  [phone]  Name: Mahi Zabriskie MRN: [id number] Date of Birth: Jan 05, 1956

## 2016-11-01 ENCOUNTER — Encounter: Payer: BLUE CROSS/BLUE SHIELD | Admitting: Physical Therapy

## 2016-11-08 ENCOUNTER — Ambulatory Visit: Payer: BLUE CROSS/BLUE SHIELD | Attending: Primary Care | Admitting: Physical Therapy

## 2016-11-08 DIAGNOSIS — M791 Myalgia, unspecified site: Secondary | ICD-10-CM

## 2016-11-08 DIAGNOSIS — R278 Other lack of coordination: Secondary | ICD-10-CM | POA: Insufficient documentation

## 2016-11-08 NOTE — Therapy (Signed)
[place] MAIN [facility] SERVICES [address] [place], [place], [address] Phone: [phone]   Fax:  [phone]  Physical Therapy Treatment  Patient Details  Name: Karen Prince MRN: [id number] Date of Birth: Nov 25, 1955 No Data Recorded  Encounter Date: 11/08/2016      PT End of Session - 11/08/16 0859    Visit Number 7   Number of Visits 12   Date for PT Re-Evaluation 11/20/16   PT Start Time 0805   PT Stop Time 5852   PT Time Calculation (min) 53 min   Activity Tolerance Patient tolerated treatment well;No increased pain   Behavior During Therapy WFL for tasks assessed/performed      Past Medical History:  Diagnosis Date  . ADHD (attention deficit hyperactivity disorder)   . Anemia   . Chicken pox   . Depression   . Essential tremor   . Hearing loss    follows with audologist  . IBS (irritable bowel syndrome)   . Insomnia   . Leucopenia    has blood draw every 5 years  . Measles   . Mitral valve prolapse   . PVC (premature ventricular contraction)     Past Surgical History:  Procedure Laterality Date  . COLONOSCOPY WITH PROPOFOL N/A 01/04/2016   Procedure: COLONOSCOPY WITH PROPOFOL;  Surgeon: Manya Silvas, MD;  Location: [facility] ENDOSCOPY;  Service: Endoscopy;  Laterality: N/A;  . INGUINAL HERNIA REPAIR Right 1984   surgery performed to remove endometrial mass and hernai was found secondary  . KNEE ARTHROSCOPY Right 1993    There were no vitals filed for this visit.      Subjective Assessment - 11/08/16 0853    Subjective Pt reported 6/10 pain in the pubic/ groin area and still has to lean back to cross ankle. Incontinence has improved by 70% with no pad wear except with increased acitivites. Pt has less leakage with sneezing and is improving with coughing.     Pertinent History ADHD, increased caffiene intake, limited education on kegel exercises, inguinal hernia repair 1984 on the R 2/2 removal of endometrial mass,  knee arthroscopy on R, IBS, mitral valve, no pregnancies. IBS, GERD (Sx occur sporadically) , Hx of bladder infections   Patient Stated Goals  get rid of surprise leakage, get exercising without worry / concern to wear a pad             [facility] PT Assessment - 11/08/16 0855      Palpation   SI assessment  L PSIS limited in hip ext end range. (increased postTx).  increased tensions.tenderness at pubic tubercle;' pectineus. (decreased post Tx)                      [place] Adult PT Treatment/Exercise - 11/08/16 7782      Therapeutic Activites    Therapeutic Activities --  see pt instructions     Manual Therapy   Manual therapy comments Grade III mob along thoracic spine T7-L1, hip add/IR of RLE over LLE PA  mob Grade III to gap SIJ 20 bouts, MWM with sustained stretch at pubic tubercle and pectineus, rotational mob    seated, MWM, coccgyeus/ ischial tub. sustained pressure                PT Education - 11/08/16 0859    Education provided Yes   Education Details HEP   Person(s) Educated Patient   Methods Explanation;Demonstration;Tactile cues;Verbal cues;Handout   Comprehension Returned demonstration;Verbalized  understanding             PT Long Term Goals - 11/08/16 0904      PT LONG TERM GOAL #1   Title Pt will decrease her PFDI score from  39%   to < 34 % in order to improve QOL and pelvic floor function   Time 12   Period Weeks   Status Partially Met     PT LONG TERM GOAL #2   Title Pt will decrease her ZUNG anxiety score from 51% to <46% in order to decrease urinary frequency and pelvic floor overacitvity.    Time 12   Period Weeks   Status On-going     PT LONG TERM GOAL #3   Title Pt will decrease her PSQI score from 43% to < 48% in order to demo improved sleep quality and less nocturia episodes   Time 12   Period Weeks   Status On-going     PT LONG TERM GOAL #4   Title Pt will demo no pelvic floor tensions/tenderness across 2 visits and  demo  improved pelvic floor excursion in order to improve rinary and bowel function.    Time 12   Period Weeks   Status Partially Met     PT LONG TERM GOAL #5   Title Pt will be able actively lift her L leg and cross ankle over R thigh without compensation in order to demo decreased coccygeus tensions, coccyx deviation to don socks and shoes   Time 12   Period Weeks   Status Partially Met               Plan - 11/08/16 0900    Clinical Impression Statement Pt continues to improve with urinary leakage. Addressed on pt's limited SIJ and functional mobility today. showed increased hip extension at end range on LLE post Tx. Manual techniques addressed limited arthrokinematics of L SIJ and pubic attachments.  Pt rpeorted decreased anterior hip pain from 6/10 to 1/10 and was able to perform AAROM exercise for hip ER/add/flex. Since pt has gained SIJ, hip ROM today, PT plans to address weakness next session.  PT continues to benefit from skilled PT.    Rehab Potential Good   Clinical Impairments Affecting Rehab Potential ADHD, increased caffiene intake, limited education on kegel exercises, inguinal hernia repair 1984 on the R 2/2 removal of endometrial mass, knee arthroscopy on R, IBS, mitral valve, IBS, GERD (Sx occur sporadically) , Hx of bladder infections   PT Frequency 1x / week   PT Duration 12 weeks   PT Treatment/Interventions ADLs/Self Care Home Management;Moist Heat;Electrical Stimulation;Functional mobility training;Therapeutic activities;Therapeutic exercise;Balance training;Patient/family education;Neuromuscular re-education;Scar mobilization;Manual techniques;Energy conservation;Taping;Gait training;Stair training;Manual lymph drainage      Patient will benefit from skilled therapeutic intervention in order to improve the following deficits and impairments:  Increased muscle spasms, Decreased activity tolerance, Decreased coordination, Hypermobility, Postural dysfunction, Pain,  Decreased strength, Increased fascial restricitons, Decreased range of motion, Decreased mobility, Decreased endurance, Hypomobility, Difficulty walking, Decreased scar mobility, Improper body mechanics  Visit Diagnosis: Myalgia  Other lack of coordination     Problem List Patient Active Problem List   Diagnosis Date Noted  . Urinary incontinence 06/28/2016  . Preventative health care 06/28/2016  . Insomnia 10/26/2015  . Depression 04/28/2015  . Attention deficit hyperactivity disorder 04/28/2015    Jerl Mina ,PT, DPT, E-RYT  11/08/2016, 9:06 AM  [place] [facility] MAIN [facility] SERVICES [address] [place], [place], [address]  Phone: [phone]   Fax:  [phone]  Name: Karen Prince MRN: [id number] Date of Birth: 05/28/1956

## 2016-11-08 NOTE — Patient Instructions (Addendum)
Clam shells  AAROM with belt to lift ankle to R knee  (press isch tub on L more to seat)   10 reps x 3/ 3 x day.   Continue with stretches from last visits

## 2016-11-15 ENCOUNTER — Ambulatory Visit: Payer: BLUE CROSS/BLUE SHIELD | Admitting: Physical Therapy

## 2016-11-15 DIAGNOSIS — R278 Other lack of coordination: Secondary | ICD-10-CM

## 2016-11-15 DIAGNOSIS — M791 Myalgia, unspecified site: Secondary | ICD-10-CM

## 2016-11-15 NOTE — Therapy (Signed)
[place] MAIN [facility] SERVICES [address] [place], [place], [address] Phone: [phone]   Fax:  [phone]  Physical Therapy Treatment  Patient Details  Name: Karen Prince MRN: [id number] Date of Birth: 21-Dec-1955 No Data Recorded  Encounter Date: 11/15/2016      PT End of Session - 11/15/16 1441    Visit Number 8   Number of Visits 12   Date for PT Re-Evaluation 11/20/16   PT Start Time 1410   PT Stop Time 1445   PT Time Calculation (min) 35 min   Activity Tolerance Patient tolerated treatment well;No increased pain   Behavior During Therapy WFL for tasks assessed/performed      Past Medical History:  Diagnosis Date  . ADHD (attention deficit hyperactivity disorder)   . Anemia   . Chicken pox   . Depression   . Essential tremor   . Hearing loss    follows with audologist  . IBS (irritable bowel syndrome)   . Insomnia   . Leucopenia    has blood draw every 5 years  . Measles   . Mitral valve prolapse   . PVC (premature ventricular contraction)     Past Surgical History:  Procedure Laterality Date  . COLONOSCOPY WITH PROPOFOL N/A 01/04/2016   Procedure: COLONOSCOPY WITH PROPOFOL;  Surgeon: Manya Silvas, MD;  Location: [facility] ENDOSCOPY;  Service: Endoscopy;  Laterality: N/A;  . INGUINAL HERNIA REPAIR Right 1984   surgery performed to remove endometrial mass and hernai was found secondary  . KNEE ARTHROSCOPY Right 1993    There were no vitals filed for this visit.      Subjective Assessment - 11/15/16 1413    Subjective Pt had only one minimal leakage across one week. Pt feels her hips are looser. Crossing her B ankle still requires assistance of her hands    Pertinent History ADHD, increased caffiene intake, limited education on kegel exercises, inguinal hernia repair 1984 on the R 2/2 removal of endometrial mass, knee arthroscopy on R, IBS, mitral valve, no pregnancies. IBS, GERD (Sx occur sporadically) , Hx of  bladder infections   Patient Stated Goals  get rid of surprise leakage, get exercising without worry / concern to wear a pad             [facility] PT Assessment - 11/15/16 1454      Observation/Other Assessments   Observations postTx: pt able to cross ankles over thighs B with posterior tilt without UE assistance   R ankle over L with less mobility                  Pelvic Floor Special Questions - 11/15/16 1439    Pelvic Floor Internal Exam pt consented verbally without contraindications   Exam Type Vaginal   Palpation increased mm/ tenderness  iliococcygeus/ puborectalis on L (decreased post Tx)            [facility] Adult PT Treatment/Exercise - 11/15/16 1440      Manual Therapy   Internal Pelvic Floor thiele massage, STM at problem areas                 PT Education - 11/15/16 1441    Education provided Yes   Education Details HEP   Person(s) Educated Patient   Methods Demonstration;Explanation;Verbal cues;Tactile cues   Comprehension Verbalized understanding;Returned demonstration             PT Long Term Goals - 11/08/16 2482  PT LONG TERM GOAL #1   Title Pt will decrease her PFDI score from  39%   to < 34 % in order to improve QOL and pelvic floor function   Time 12   Period Weeks   Status Partially Met     PT LONG TERM GOAL #2   Title Pt will decrease her ZUNG anxiety score from 51% to <46% in order to decrease urinary frequency and pelvic floor overacitvity.    Time 12   Period Weeks   Status On-going     PT LONG TERM GOAL #3   Title Pt will decrease her PSQI score from 43% to < 48% in order to demo improved sleep quality and less nocturia episodes   Time 12   Period Weeks   Status On-going     PT LONG TERM GOAL #4   Title Pt will demo no pelvic floor tensions/tenderness across 2 visits and demo  improved pelvic floor excursion in order to improve rinary and bowel function.    Time 12   Period Weeks   Status Partially Met      PT LONG TERM GOAL #5   Title Pt will be able actively lift her L leg and cross ankle over R thigh without compensation in order to demo decreased coccygeus tensions, coccyx deviation to don socks and shoes   Time 12   Period Weeks   Status Partially Met               Plan - 11/15/16 1441    Clinical Impression Statement Pt showed signficantly decreased mm tensions but required manual Tx to decrease remaining areas of tenderness and tensions at puborectalis/ iliococcgyeus on L. Anticipate this will continue ot help pt achieve more hip ROM for crossing her ankle of thigh. Following Tx: Pt was able to cross ankle over thigh with use of posterior tilt of pelvis and no hand assistance. Progressed to deep core level 4 modified to promote lengthening of puborectalis/ iliococcygeus. Pt continues to benefit from skilled PT.     Rehab Potential Good   Clinical Impairments Affecting Rehab Potential ADHD, increased caffiene intake, limited education on kegel exercises, inguinal hernia repair 1984 on the R 2/2 removal of endometrial mass, knee arthroscopy on R, IBS, mitral valve, IBS, GERD (Sx occur sporadically) , Hx of bladder infections   PT Frequency 1x / week   PT Duration 12 weeks   PT Treatment/Interventions ADLs/Self Care Home Management;Moist Heat;Electrical Stimulation;Functional mobility training;Therapeutic activities;Therapeutic exercise;Balance training;Patient/family education;Neuromuscular re-education;Scar mobilization;Manual techniques;Energy conservation;Taping;Gait training;Stair training;Manual lymph drainage      Patient will benefit from skilled therapeutic intervention in order to improve the following deficits and impairments:  Increased muscle spasms, Decreased activity tolerance, Decreased coordination, Hypermobility, Postural dysfunction, Pain, Decreased strength, Increased fascial restricitons, Decreased range of motion, Decreased mobility, Decreased endurance,  Hypomobility, Difficulty walking, Decreased scar mobility, Improper body mechanics  Visit Diagnosis: Myalgia  Other lack of coordination     Problem List Patient Active Problem List   Diagnosis Date Noted  . Urinary incontinence 06/28/2016  . Preventative health care 06/28/2016  . Insomnia 10/26/2015  . Depression 04/28/2015  . Attention deficit hyperactivity disorder 04/28/2015    Jerl Mina ,PT, DPT, E-RYT  11/15/2016, 2:56 PM  [place] MAIN [facility] SERVICES [address] [place], [place], [address] Phone: [phone]   Fax:  [phone]  Name: Karen Prince MRN: [id number] Date of Birth: April 29, 1956

## 2016-11-29 ENCOUNTER — Encounter: Payer: BLUE CROSS/BLUE SHIELD | Admitting: Physical Therapy

## 2016-12-06 ENCOUNTER — Ambulatory Visit: Payer: BLUE CROSS/BLUE SHIELD | Attending: Primary Care | Admitting: Physical Therapy

## 2016-12-06 DIAGNOSIS — R278 Other lack of coordination: Secondary | ICD-10-CM

## 2016-12-06 DIAGNOSIS — M791 Myalgia, unspecified site: Secondary | ICD-10-CM

## 2016-12-06 NOTE — Patient Instructions (Signed)
Lower abdominal activaiton instead of upper abdominal on exhalation   pillow under hips with deep core level 1   Frog stretch 30 reps

## 2016-12-06 NOTE — Therapy (Signed)
[place] MAIN [facility] SERVICES [address] [place], [place], [address] Phone: [phone]   Fax:  [phone]  Physical Therapy Treatment / Progress Note  Patient Details  Name: Karen Prince MRN: [id number] Date of Birth: 1956/03/01 No Data Recorded  Encounter Date: 12/06/2016      PT End of Session - 12/06/16 1925    Visit Number 9   Number of Visits 12   Date for PT Re-Evaluation 02/21/17   PT Start Time 0802   PT Stop Time 0900   PT Time Calculation (min) 58 min   Activity Tolerance Patient tolerated treatment well;No increased pain   Behavior During Therapy WFL for tasks assessed/performed      Past Medical History:  Diagnosis Date  . ADHD (attention deficit hyperactivity disorder)   . Anemia   . Chicken pox   . Depression   . Essential tremor   . Hearing loss    follows with audologist  . IBS (irritable bowel syndrome)   . Insomnia   . Leucopenia    has blood draw every 5 years  . Measles   . Mitral valve prolapse   . PVC (premature ventricular contraction)     Past Surgical History:  Procedure Laterality Date  . COLONOSCOPY WITH PROPOFOL N/A 01/04/2016   Procedure: COLONOSCOPY WITH PROPOFOL;  Surgeon: Manya Silvas, MD;  Location: [facility] ENDOSCOPY;  Service: Endoscopy;  Laterality: N/A;  . INGUINAL HERNIA REPAIR Right 1984   surgery performed to remove endometrial mass and hernai was found secondary  . KNEE ARTHROSCOPY Right 1993    There were no vitals filed for this visit.      Subjective Assessment - 12/06/16 0805    Subjective Pt reported she noticed she had leakage without control when she was wearing a backpack and had done alot of walking at a conference. Pt only had to rely on pads two times only during the 6 day conference that involved lots of walking. Pt was able to sit cross legged on the floor which is something she has not been able to do in a long time. Pt has also been able to go without her  inserts and not have feet problems.     Pertinent History ADHD, increased caffiene intake, limited education on kegel exercises, inguinal hernia repair 1984 on the R 2/2 removal of endometrial mass, knee arthroscopy on R, IBS, mitral valve, no pregnancies. IBS, GERD (Sx occur sporadically) , Hx of bladder infections   Patient Stated Goals  get rid of surprise leakage, get exercising without worry / concern to wear a pad             [facility] PT Assessment - 12/06/16 1922      Observation/Other Assessments   Observations post Tx: pt able to cross L ankle over R thigh without UE assistance and minimal posterior trunk lean      Other:   Other/Comments pt able to sit crossed legged, knees leveled w/ iliac crest, hips elevated onto blocks faciliated lumbar lordosis                  Pelvic Floor Special Questions - 12/06/16 1919    External Palpation prone: coccyx deviated L with tensions and tenderness at L coccygeus   Pelvic Floor Internal Exam pt consented verbally without contraindications   Exam Type Vaginal   Palpation initally tight 1st-3rd layers but pt was able to relax muscles without manual Tx.  fascial tensions noted posterior  pubic tubercle B and limited lift with exhalation over suprapubic region. Bladder slightly lowered. 3rd layer with limited contraction. ( improved with hips elevated over pillow, manual techniques internally and externally suprapubic area)             [facility] Adult PT Treatment/Exercise - 12/06/16 1924      Neuro Re-ed    Neuro Re-ed Details  cued for lower abdominal activation instead of upper abdominal activation, circumferential and sequential contraction of pelvic floor on exhalation     Manual Therapy   Manual therapy comments STM along L coccygeus mm in prone   Internal Pelvic Floor fascial release over problem areas externall and internally                      PT Long Term Goals - 12/06/16 1926      PT LONG TERM GOAL #1    Title Pt will decrease her PFDI score from  39%   to < 34 % in order to improve QOL and pelvic floor function   Time 12   Period Weeks   Status Partially Met     PT LONG TERM GOAL #2   Title Pt will decrease her ZUNG anxiety score from 51% to <46% in order to decrease urinary frequency and pelvic floor overacitvity.    Time 12   Period Weeks   Status Partially Met     PT LONG TERM GOAL #3   Title Pt will decrease her PSQI score from 43% to < 48% in order to demo improved sleep quality and less nocturia episodes   Time 12   Period Weeks   Status Partially Met     PT LONG TERM GOAL #4   Title Pt will demo no pelvic floor tensions/tenderness across 2 visits and demo  improved pelvic floor excursion in order to improve rinary and bowel function.    Time 12   Period Weeks   Status Achieved     PT LONG TERM GOAL #5   Title Pt will be able actively lift her L leg and cross ankle over R thigh without compensation in order to demo decreased coccygeus tensions, coccyx deviation to don socks and shoes   Time 12   Period Weeks   Status Achieved     Additional Long Term Goals   Additional Long Term Goals Yes     PT LONG TERM GOAL #6   Title Pt will demo proper activation of pelvic floors mm sequentially and circumferentially in order to progress to proper kegels to build endurance of pelvic floor to minimize leakage and risk for prolapse   Time 12   Period Weeks   Status New               Plan - 12/06/16 1926    Clinical Impression Statement Pt is progress well towards her goals with increased continence and hip mobility.Pt has been able to minimize leakage with increased walking, standing, and sit to stand transfers. Pt also has demo'd ability to cross L ankle over R thigh and sit crossed legged which were both positions that pt was not able to perform for many years. Pt also demo'd less pelvic floor tensions and ability to relax them IND.  Pt continues to require skilled PT to  address pelvic floor function associated with remaining deficits : coccyx deviation, slightly lowered position of bladder, and poor coordination of pelvic floor mm due to limited mobility over urogential triangle.  Rehab Potential Good   Clinical Impairments Affecting Rehab Potential ADHD, increased caffiene intake, limited education on kegel exercises, inguinal hernia repair 1984 on the R 2/2 removal of endometrial mass, knee arthroscopy on R, IBS, mitral valve, IBS, GERD (Sx occur sporadically) , Hx of bladder infections   PT Frequency 1x / week   PT Duration 12 weeks   PT Treatment/Interventions ADLs/Self Care Home Management;Moist Heat;Electrical Stimulation;Functional mobility training;Therapeutic activities;Therapeutic exercise;Balance training;Patient/family education;Neuromuscular re-education;Scar mobilization;Manual techniques;Energy conservation;Taping;Gait training;Stair training;Manual lymph drainage      Patient will benefit from skilled therapeutic intervention in order to improve the following deficits and impairments:  Increased muscle spasms, Decreased activity tolerance, Decreased coordination, Hypermobility, Postural dysfunction, Pain, Decreased strength, Increased fascial restricitons, Decreased range of motion, Decreased mobility, Decreased endurance, Hypomobility, Difficulty walking, Decreased scar mobility, Improper body mechanics  Visit Diagnosis: Other lack of coordination  Myalgia     Problem List Patient Active Problem List   Diagnosis Date Noted  . Urinary incontinence 06/28/2016  . Preventative health care 06/28/2016  . Insomnia 10/26/2015  . Depression 04/28/2015  . Attention deficit hyperactivity disorder 04/28/2015    Jerl Mina ,PT, DPT, E-RYT  12/06/2016, 7:30 PM  [place] MAIN [facility] SERVICES [address] [place], [place], [address] Phone: [phone]   Fax:  [phone]  Name: Shakela Donati MRN: [id number] Date of Birth: 06/17/1956

## 2016-12-16 ENCOUNTER — Ambulatory Visit: Payer: BLUE CROSS/BLUE SHIELD | Admitting: Physical Therapy

## 2016-12-16 DIAGNOSIS — R278 Other lack of coordination: Secondary | ICD-10-CM | POA: Diagnosis not present

## 2016-12-16 DIAGNOSIS — M791 Myalgia, unspecified site: Secondary | ICD-10-CM

## 2016-12-16 NOTE — Patient Instructions (Signed)
Pillow under hips with breathing deep core level 1   Hip flexion/ext in Single leg stance  10 reps x 2    SLS balance with opp heel creating isometric contraction against standing foot

## 2016-12-16 NOTE — Therapy (Signed)
[place] MAIN [facility] SERVICES [address] [place], [place], [address] Phone: [phone]   Fax:  [phone]  Physical Therapy Treatment  Patient Details  Name: Karen Prince MRN: [id number] Date of Birth: [birth year] No Data Recorded  Encounter Date: 12/16/2016      PT End of Session - 12/16/16 0917    Visit Number 10   Number of Visits 12   Date for PT Re-Evaluation 02/21/17   PT Start Time 0910   PT Stop Time 1007   PT Time Calculation (min) 57 min   Activity Tolerance Patient tolerated treatment well;No increased pain   Behavior During Therapy WFL for tasks assessed/performed      Past Medical History:  Diagnosis Date  . ADHD (attention deficit hyperactivity disorder)   . Anemia   . Chicken pox   . Depression   . Essential tremor   . Hearing loss    follows with audologist  . IBS (irritable bowel syndrome)   . Insomnia   . Leucopenia    has blood draw every 5 years  . Measles   . Mitral valve prolapse   . PVC (premature ventricular contraction)     Past Surgical History:  Procedure Laterality Date  . COLONOSCOPY WITH PROPOFOL N/A 01/04/2016   Procedure: COLONOSCOPY WITH PROPOFOL;  Surgeon: Manya Silvas, MD;  Location: [facility] ENDOSCOPY;  Service: Endoscopy;  Laterality: N/A;  . INGUINAL HERNIA REPAIR Right 1984   surgery performed to remove endometrial mass and hernai was found secondary  . KNEE ARTHROSCOPY Right 1993    There were no vitals filed for this visit.      Subjective Assessment - 12/16/16 0913    Subjective Pt had a fell on the stairs last week climbing upwards but caught her foot under the ledge of the top step. Pt fell forward onto her hands and only got bruises on her elbows. Pt noticed she noticed she was not paying attention to her stepping. Pt was able to get back up but her bladder had let go when she hit the ground. She had leakage down her leg. Pt felt disgusted and frustrated that she had  sudden leakage and had a fall for the second time with stair climbing.  Pt states she has a  Dx with essential tremor with basal ganglia changes  in 1975. Pt has had weaker reflexes on her L side most of her life.  Pt has not seen her neurologist since 2005.    Pertinent History ADHD, increased caffiene intake, limited education on kegel exercises, inguinal hernia repair 1984 on the R 2/2 removal of endometrial mass, knee arthroscopy on R, IBS, mitral valve, no pregnancies. IBS, GERD (Sx occur sporadically) , Hx of bladder infections   Patient Stated Goals  get rid of surprise leakage, get exercising without worry / concern to wear a pad             [facility] PT Assessment - 12/16/16 1442      Other:   Other/ Comments R SLS with hip flex/ hi ext (trendelenberg pre-Tx, post Tx: more stability and achieve reps 17 reps instead 4 reps)      Other:   Other/Comments DTR: RLE 2+, LLE 1+ patellar and Achilles      Palpation   SI assessment  R ASIS more anterior ( more symmetrical post Tx)                  Pelvic Floor Special Questions -  12/16/16 1441    Pelvic Floor Internal Exam pt consented verbally without contraindications   Exam Type Vaginal   Palpation no pelvic floor mm tightness/ tenderness. bladder in a slightly lowered position within introitus. Pt demo'd improved circumferential contraction post Tx, and reported increased propioception with pillow under hips.             [place] Adult PT Treatment/Exercise - 12/16/16 1032      Therapeutic Activites    Therapeutic Activities --  standing hip flexion/ext in SLS 10 reps x 2      Neuro Re-ed    Neuro Re-ed Details  cued for SLS for more weight shift onto medial arch on LLE      Manual Therapy   Manual therapy comments rotational mob into hip flexion and hip abd on R to correct pelvic obliquity    Internal Pelvic Floor faciliation of ischiocavernosus B and lateral border of bladder internally and externally to                  PT Education - 12/16/16 1446    Education provided Yes   Education Details HEP, education to consult her neurologist for an updated study on the progression of her essential tremor Dx and to practice relaxation and mindfulness when navigating the community to minimize falls              PT Long Term Goals - 12/16/16 1007      PT LONG TERM GOAL #1   Title Pt will decrease her PFDI score from  39%   to < 34 % in order to improve QOL and pelvic floor function   Time 12   Period Weeks   Status Partially Met     PT LONG TERM GOAL #2   Title Pt will decrease her ZUNG anxiety score from 51% to <46% in order to decrease urinary frequency and pelvic floor overacitvity.    Time 12   Period Weeks   Status Partially Met     PT LONG TERM GOAL #3   Title Pt will decrease her PSQI score from 43% to < 48% in order to demo improved sleep quality and less nocturia episodes   Time 12   Period Weeks   Status Partially Met     PT LONG TERM GOAL #4   Title Pt will demo no pelvic floor tensions/tenderness across 2 visits and demo  improved pelvic floor excursion in order to improve rinary and bowel function.    Time 12   Period Weeks   Status Achieved     PT LONG TERM GOAL #5   Title Pt will be able actively lift her L leg and cross ankle over R thigh without compensation in order to demo decreased coccygeus tensions, coccyx deviation to don socks and shoes   Time 12   Period Weeks   Status Achieved     PT LONG TERM GOAL #6   Title Pt will demo proper activation of pelvic floor mm sequentially and circumferentially in order to progress to proper kegels to build endurance of pelvic floor to minimize leakage and risk for prolapse   Time 12   Period Weeks   Status On-going               Plan - 12/16/16 2302    Clinical Impression Statement Pt continues to show increased hip mobility and no pelvic floor mm tensions. Applied manual Tx and neuro-re-education to  address pt's lowered bladder  position which was likely associated with pt's recent report of her loss of urinary control during her fallwhen she was climbing steps. Plan to progress pt to pelvic floor endurance training at upcoming sessions because she has shown decreased pelvic floor tensions consistently the past month. Addressed pt's fall which was related to pt being in a rush and not paying attention to clearing her R foot the ledge of upcoming step. She demo'd LLE stability weakness and is ready to undergo more strengthening along her lower kinetic chain in upcoming sessions because she has gained significant ROM at her SIJ and L hip. Pt showed decreased deep tendon reflex on LLE which pt reported to have been weakness since her Dx of Essential Tremor in her young adult years. Pt has not had an updated neurological check up since 2005 and therefore, was recommended to follow up with her MD. Pt voiced understanding.   Plan to continue pt's BLE strength and will refer out as needed. Pt continues to benefit from skilled PT.       Rehab Potential Good   Clinical Impairments Affecting Rehab Potential ADHD, increased caffiene intake, limited education on kegel exercises, inguinal hernia repair 1984 on the R 2/2 removal of endometrial mass, knee arthroscopy on R, IBS, mitral valve, IBS, GERD (Sx occur sporadically) , Hx of bladder infections   PT Frequency 1x / week   PT Duration 12 weeks   PT Treatment/Interventions ADLs/Self Care Home Management;Moist Heat;Electrical Stimulation;Functional mobility training;Therapeutic activities;Therapeutic exercise;Balance training;Patient/family education;Neuromuscular re-education;Scar mobilization;Manual techniques;Energy conservation;Taping;Gait training;Stair training;Manual lymph drainage      Patient will benefit from skilled therapeutic intervention in order to improve the following deficits and impairments:  Increased muscle spasms, Decreased activity tolerance,  Decreased coordination, Hypermobility, Postural dysfunction, Pain, Decreased strength, Increased fascial restricitons, Decreased range of motion, Decreased mobility, Decreased endurance, Hypomobility, Difficulty walking, Decreased scar mobility, Improper body mechanics  Visit Diagnosis: Other lack of coordination  Myalgia     Problem List Patient Active Problem List   Diagnosis Date Noted  . Urinary incontinence 06/28/2016  . Preventative health care 06/28/2016  . Insomnia 10/26/2015  . Depression 04/28/2015  . Attention deficit hyperactivity disorder 04/28/2015    Jerl Mina ,PT, DPT, E-RYT  12/16/2016, 11:17 PM  [place] MAIN [facility] SERVICES [address] [place], [place], [address] Phone: [phone]   Fax:  [phone]  Name: Karen Prince MRN: [id number] Date of Birth: November 11, 1955

## 2016-12-26 ENCOUNTER — Ambulatory Visit: Payer: BLUE CROSS/BLUE SHIELD | Admitting: Physical Therapy

## 2016-12-26 DIAGNOSIS — R278 Other lack of coordination: Secondary | ICD-10-CM | POA: Diagnosis not present

## 2016-12-26 DIAGNOSIS — M791 Myalgia, unspecified site: Secondary | ICD-10-CM

## 2016-12-26 NOTE — Therapy (Signed)
[place] MAIN [facility] SERVICES [address] [place], [place], [address] Phone: [phone]   Fax:  [phone]  Physical Therapy Treatment  Patient Details  Name: Karen Prince MRN: [id number] Date of Birth: 1955/08/27 No Data Recorded  Encounter Date: 12/26/2016      PT End of Session - 12/26/16 2242    Visit Number 11   Number of Visits 12   Date for PT Re-Evaluation 02/21/17   PT Start Time 6270   PT Stop Time 1507   PT Time Calculation (min) 22 min   Activity Tolerance Patient tolerated treatment well;No increased pain   Behavior During Therapy WFL for tasks assessed/performed      Past Medical History:  Diagnosis Date  . ADHD (attention deficit hyperactivity disorder)   . Anemia   . Chicken pox   . Depression   . Essential tremor   . Hearing loss    follows with audologist  . IBS (irritable bowel syndrome)   . Insomnia   . Leucopenia    has blood draw every 5 years  . Measles   . Mitral valve prolapse   . PVC (premature ventricular contraction)     Past Surgical History:  Procedure Laterality Date  . COLONOSCOPY WITH PROPOFOL N/A 01/04/2016   Procedure: COLONOSCOPY WITH PROPOFOL;  Surgeon: Manya Silvas, MD;  Location: [facility] ENDOSCOPY;  Service: Endoscopy;  Laterality: N/A;  . INGUINAL HERNIA REPAIR Right 1984   surgery performed to remove endometrial mass and hernai was found secondary  . KNEE ARTHROSCOPY Right 1993    There were no vitals filed for this visit.      Subjective Assessment - 12/26/16 1448    Subjective Pt reported no leakage since last session and now is able to cross her L ankle over R thigh with full ROM and no deviations   Pertinent History ADHD, increased caffiene intake, limited education on kegel exercises, inguinal hernia repair 1984 on the R 2/2 removal of endometrial mass, knee arthroscopy on R, IBS, mitral valve, no pregnancies. IBS, GERD (Sx occur sporadically) , Hx of bladder  infections   Patient Stated Goals  get rid of surprise leakage, get exercising without worry / concern to wear a pad             [facility] PT Assessment - 12/26/16 1450      AROM   Overall AROM Comments  cross her L ankle over R thigh with full ROM and no trunk lean backwards to compensations                   Pelvic Floor Special Questions - 12/26/16 1512    Pelvic Floor Internal Exam pt consented verbally without contraindications   Exam Type Vaginal   Palpation no pelvic floor mm tightness/ tenderness. Bladder in a more cranial position without pillow under hips.     Strength # of reps 3   Strength # of seconds 1  cued and tactile cranial facilitation of urogenital triangle           [facility] Adult PT Treatment/Exercise - 12/26/16 1513      Therapeutic Activites    Therapeutic Activities --  re assessed goals     Manual Therapy   Internal Pelvic Floor faciliation of fascia posterior aspect of pubic tubercle  to create a cranial lift of 2nd layer of mm and a more cranial lift of urogential triangle  PT Long Term Goals - 12/26/16 1450      PT LONG TERM GOAL #1   Title Pt will decrease her PFDI score from  39%   to < 34 % in order to improve QOL and pelvic floor function  (5/31: 36%)    Time 12   Period Weeks   Status Partially Met     PT LONG TERM GOAL #2   Title Pt will decrease her ZUNG anxiety score from 51% to <46% in order to decrease urinary frequency and pelvic floor overacitvity.  (5/31: 39%)    Time 12   Period Weeks   Status Achieved     PT LONG TERM GOAL #3   Title Pt will decrease her PSQI score from 43% to < 38% in order to demo improved sleep quality and less nocturia episodes (5/31: 33%)    Time 12   Period Weeks   Status Achieved     PT LONG TERM GOAL #4   Title Pt will demo no pelvic floor tensions/tenderness across 2 visits and demo  improved pelvic floor excursion in order to improve rinary and bowel  function.    Time 12   Period Weeks   Status Achieved     PT LONG TERM GOAL #5   Title Pt will be able actively lift her L leg and cross ankle over R thigh without compensation in order to demo decreased coccygeus tensions, coccyx deviation to don socks and shoes   Time 12   Period Weeks   Status Achieved     PT LONG TERM GOAL #6   Title Pt will demo proper activation of pelvic floor mm sequentially and circumferentially in order to progress to proper kegels to build endurance of pelvic floor to minimize leakage and risk for prolapse   Time 12   Period Weeks   Status Achieved               Plan - 12/26/16 2243    Clinical Impression Statement Pt has achieved 5/6 goals and is close to achieving her remaining goal. Pt has demo'd significantly decreased pelvic floor and hip mm tensions, improved pelvic girdle alignment and hip ROM. Pt has had improved urinary control, a more cranial position of her bladder, and proper coordination of her pelvic floor mm and other deep core mm.  Pt has been able to attend conferences and walk for long periods without leakage and has restored her hip ROM to cross her ankle over opposite thigh to don socks and shoes.  Pt is close to d/c and has one more appt to see how pt fares independently across one month.    Rehab Potential Good   Clinical Impairments Affecting Rehab Potential ADHD, increased caffiene intake, limited education on kegel exercises, inguinal hernia repair 1984 on the R 2/2 removal of endometrial mass, knee arthroscopy on R, IBS, mitral valve, IBS, GERD (Sx occur sporadically) , Hx of bladder infections   PT Frequency 1x / week   PT Duration 12 weeks   PT Treatment/Interventions ADLs/Self Care Home Management;Moist Heat;Electrical Stimulation;Functional mobility training;Therapeutic activities;Therapeutic exercise;Balance training;Patient/family education;Neuromuscular re-education;Scar mobilization;Manual techniques;Energy  conservation;Taping;Gait training;Stair training;Manual lymph drainage      Patient will benefit from skilled therapeutic intervention in order to improve the following deficits and impairments:  Increased muscle spasms, Decreased activity tolerance, Decreased coordination, Hypermobility, Postural dysfunction, Pain, Decreased strength, Increased fascial restricitons, Decreased range of motion, Decreased mobility, Decreased endurance, Hypomobility, Difficulty walking, Decreased scar mobility, Improper body mechanics  Visit Diagnosis: Other lack of coordination  Myalgia     Problem List Patient Active Problem List   Diagnosis Date Noted  . Urinary incontinence 06/28/2016  . Preventative health care 06/28/2016  . Insomnia 10/26/2015  . Depression 04/28/2015  . Attention deficit hyperactivity disorder 04/28/2015    Jerl Mina ,PT, DPT, E-RYT  12/26/2016, 10:49 PM  [place] MAIN [facility] SERVICES [address] [place], [place], [address] Phone: [phone]   Fax:  [phone]  Name: Karen Prince MRN: [id number] Date of Birth: 04/28/1956

## 2017-02-06 ENCOUNTER — Telehealth: Payer: Self-pay | Admitting: Primary Care

## 2017-02-06 NOTE — Telephone Encounter (Signed)
Pt sent following request on MyChart:   Appointment Request From: Karen Prince    With Provider: Sheral Flow, NP [facility] [place]    Preferred Date Range: From 02/10/2017 To 02/14/2017    Preferred Times: Any    Reason: To address the following health maintenance concerns.  Tetanus/Tdap    Comments:  Also to update: First shot of Shingrix on January 31, 2017

## 2017-02-10 NOTE — Telephone Encounter (Signed)
Message left for patient to return my call.  

## 2017-02-12 ENCOUNTER — Ambulatory Visit: Payer: BLUE CROSS/BLUE SHIELD | Attending: Primary Care | Admitting: Physical Therapy

## 2017-02-12 DIAGNOSIS — R278 Other lack of coordination: Secondary | ICD-10-CM

## 2017-02-12 DIAGNOSIS — M791 Myalgia, unspecified site: Secondary | ICD-10-CM

## 2017-02-12 NOTE — Therapy (Signed)
[place] MAIN [facility] SERVICES [address] [place], [place], [address] Phone: [phone]   Fax:  [phone]  Physical Therapy Discharge Summary   Patient Details  Name: Karen Prince MRN: [id number] Date of Birth: 09-11-1955 No Data Recorded  Encounter Date: 02/12/2017    Past Medical History:  Diagnosis Date  . ADHD (attention deficit hyperactivity disorder)   . Anemia   . Chicken pox   . Depression   . Essential tremor   . Hearing loss    follows with audologist  . IBS (irritable bowel syndrome)   . Insomnia   . Leucopenia    has blood draw every 5 years  . Measles   . Mitral valve prolapse   . PVC (premature ventricular contraction)     Past Surgical History:  Procedure Laterality Date  . COLONOSCOPY WITH PROPOFOL N/A 01/04/2016   Procedure: COLONOSCOPY WITH PROPOFOL;  Surgeon: Manya Silvas, MD;  Location: [facility] ENDOSCOPY;  Service: Endoscopy;  Laterality: N/A;  . INGUINAL HERNIA REPAIR Right 1984   surgery performed to remove endometrial mass and hernai was found secondary  . KNEE ARTHROSCOPY Right 1993    There were no vitals filed for this visit.      Subjective Assessment - 02/12/17 1308    Subjective Pt had two minor incidents where she loss control with minimal leakage on her out of country travel. Pt was able to delay urination in [facility] store with coordination of pelvic floor muscles.     Pertinent History ADHD, increased caffiene intake, limited education on kegel exercises, inguinal hernia repair 1984 on the R 2/2 removal of endometrial mass, knee arthroscopy on R, IBS, mitral valve, no pregnancies. IBS, GERD (Sx occur sporadically) , Hx of bladder infections   Patient Stated Goals  get rid of surprise leakage, get exercising without worry / concern to wear a pad                     PT Long Term Goals - 02/12/17 1307      PT LONG TERM GOAL #1   Title Pt will decrease her PFDI score from  39%    to < 34 % in order to improve QOL and pelvic floor function  (5/31: 36%, 7/18: 30%) )    Time 12   Period Weeks   Status Achieved     PT LONG TERM GOAL #2   Title Pt will decrease her ZUNG anxiety score from 51% to <46% in order to decrease urinary frequency and pelvic floor overacitvity.  (5/31: 39%)    Time 12   Period Weeks   Status Achieved     PT LONG TERM GOAL #3   Title Pt will decrease her PSQI score from 43% to < 38% in order to demo improved sleep quality and less nocturia episodes (5/31: 33%)    Time 12   Period Weeks   Status Achieved     PT LONG TERM GOAL #4   Title Pt will demo no pelvic floor tensions/tenderness across 2 visits and demo  improved pelvic floor excursion in order to improve rinary and bowel function.    Time 12   Period Weeks   Status Achieved     PT LONG TERM GOAL #5   Title Pt will be able actively lift her L leg and cross ankle over R thigh without compensation in order to demo decreased coccygeus tensions, coccyx deviation to don socks and shoes  Time 12   Period Weeks   Status Achieved     PT LONG TERM GOAL #6   Title Pt will demo proper activation of pelvic floor mm sequentially and circumferentially in order to progress to proper kegels to build endurance of pelvic floor to minimize leakage and risk for prolapse   Time 12   Period Weeks   Status Achieved               Plan - 02/12/17 1315    Clinical Impression Statement Pt reports her Sx have improved "A Great Deal Better based GROC". She feels "she has control again". Her PFDI score decreased from 39% to 30%.  Pt has demo'd signficantly improve hip and SIJ ROM, decreased hip and pelvic floor mm tensions and tenderness, and proper coordination and strength of pelvic floor mm.  Pt has achieved 100% of her goals and is ready for d/c at this time    Rehab Potential Good   Clinical Impairments Affecting Rehab Potential ADHD, increased caffiene intake, limited education on kegel  exercises, inguinal hernia repair 1984 on the R 2/2 removal of endometrial mass, knee arthroscopy on R, IBS, mitral valve, IBS, GERD (Sx occur sporadically) , Hx of bladder infections   PT Frequency 1x / week   PT Duration 12 weeks   PT Treatment/Interventions ADLs/Self Care Home Management;Moist Heat;Electrical Stimulation;Functional mobility training;Therapeutic activities;Therapeutic exercise;Balance training;Patient/family education;Neuromuscular re-education;Scar mobilization;Manual techniques;Energy conservation;Taping;Gait training;Stair training;Manual lymph drainage      Patient will benefit from skilled therapeutic intervention in order to improve the following deficits and impairments:  Increased muscle spasms, Decreased activity tolerance, Decreased coordination, Hypermobility, Postural dysfunction, Pain, Decreased strength, Increased fascial restricitons, Decreased range of motion, Decreased mobility, Decreased endurance, Hypomobility, Difficulty walking, Decreased scar mobility, Improper body mechanics  Visit Diagnosis: Other lack of coordination  Myalgia     Problem List Patient Active Problem List   Diagnosis Date Noted  . Urinary incontinence 06/28/2016  . Preventative health care 06/28/2016  . Insomnia 10/26/2015  . Depression 04/28/2015  . Attention deficit hyperactivity disorder 04/28/2015    Jerl Mina ,PT, DPT, E-RYT  02/12/2017, 1:33 PM  [place] MAIN [facility] SERVICES [address] [place], [place], [address] Phone: [phone]   Fax:  [phone]  Name: Karen Prince MRN: [id number] Date of Birth: Mar 27, 1956

## 2017-02-12 NOTE — Telephone Encounter (Signed)
Spoken to patient on 02/11/2017 and she would like to get update Td. Patient stated that she is not sure about DTP and it was a long time ago. Schedule patient a nurse appt for the Td.  Updated the immunization for the Shingix.

## 2017-02-12 NOTE — Therapy (Deleted)
[place] MAIN [facility] SERVICES [address] [place], [place], [address] Phone: [phone]   Fax:  [phone]  Patient Details  Name: Karen Prince MRN: [id number] Date of Birth: 21-Apr-1956 Referring Provider:  Pleas Koch, NP  Encounter Date: 02/12/2017   DISCHARGE SUMMARY     Jerl Mina 02/12/2017, 1:33 PM  [place] MAIN [facility] SERVICES [address] [place], [place], [address] Phone: [phone]   Fax:  [phone]

## 2017-02-13 ENCOUNTER — Ambulatory Visit: Payer: BLUE CROSS/BLUE SHIELD

## 2017-05-29 ENCOUNTER — Ambulatory Visit: Payer: Self-pay | Admitting: Adult Health

## 2017-05-29 VITALS — BP 140/88 | HR 90 | Temp 98.2°F | Resp 16 | Wt 192.0 lb

## 2017-05-29 DIAGNOSIS — L819 Disorder of pigmentation, unspecified: Secondary | ICD-10-CM

## 2017-05-29 NOTE — Progress Notes (Signed)
Subjective:     Patient ID: Karen Prince, female   DOB: Dec 14, 1955, 60 y.o.   MRN: [id number]  HPI   Patient is a 61 year old female in no acute distress, she comes to the clinic and reports 2 new areas on her skin that she has noticed her moles are starting to darken.  She reports her husband noticed the moles under her bra band in the back within the last 10 days.  History of melanoma in family- brother.- living. Mother- she says she believes she had " a melanoma' removed early and squamous and basal cells as well.  She has no personal history of melanoma, squamous cell or basal cell carcinomas.   She reports she cane to the dermatology clinic 2 years ago for skin checkjj and had no suspicious lesions at that time.  She also reports she has seen [facility] years ago but that her doctor is no longer in that practice. She denies any personal history of skin biopsies or any skin cancers.  She denies any other changing lesions.  He sees eye doctor once a year. She has not been this year. She sees [place] and schedule.   Blood pressure 140/88, pulse 90, temperature 98.2 F (36.8 C), resp. rate 16, weight 192 lb (87.1 kg), SpO2 96 %.   Review of Systems  Constitutional: Negative.   HENT: Negative.   Eyes: Negative.   Respiratory: Negative.   Cardiovascular: Negative.        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 No knew symptoms.   Gastrointestinal: Negative.   Genitourinary: Negative.   Musculoskeletal: Negative.   Neurological: Negative.   Psychiatric/Behavioral: Negative.        Objective:   Physical Exam  Constitutional: She is oriented to person, place, and time. She appears well-developed and well-nourished. No distress.  HENT:  Head: Normocephalic and atraumatic.  Eyes: Pupils are equal, round, and reactive to light. Conjunctivae and EOM are normal.  Neck: Normal range of motion. Neck supple. No  thyromegaly present.  Cardiovascular: Normal rate, regular rhythm, normal heart sounds and intact distal pulses.  Exam reveals no gallop and no friction rub.   No murmur heard. Pulmonary/Chest: Effort normal and breath sounds normal. No respiratory distress. She has no wheezes. She has no rales. She exhibits no tenderness.  Abdominal: Soft. Bowel sounds are normal.  Musculoskeletal: Normal range of motion.  Neurological: She is alert and oriented to person, place, and time. She has normal reflexes.  Skin: Skin is warm, dry and intact. No rash noted. She is not diaphoretic. No cyanosis or erythema. No pallor. Nails show no clubbing.     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 Mixed variety of light brown with some streaks of dark brown and intermingled areas of hyperkeratosis. Patient reports color change from light to dark. No bleeding or drainage present.   Psychiatric: She has a normal mood and affect. Her behavior is normal. Judgment and thought content normal.  Nursing note and vitals reviewed.      Assessment:     Atypical pigmented skin lesion      Plan:       Patient is advised she needs to see dermatologist as soon as possible.Marland Kitchen She will need a referral from her Pleas Koch, NP as this office is not her primary care.  She is advised to call Alma Friendly  K, NP for further instructions. This office does not make referrals for patients if they have a current PCP referrals must go through that PCP office.   Patient verbalizes understanding and will call Pleas Koch, NP [phone] office for follow up within the week.   If patient has any difficulty she is to inform this office.   Patient verbalized understanding of instructions and denies any further questions at this time.

## 2017-05-29 NOTE — Patient Instructions (Signed)
CALL Lisa Roca, NP You will need a refferal   Actinic Keratosis An actinic keratosis is a precancerous growth on the skin. This means that it could develop into skin cancer if it is not treated. About 1% of these growths (actinic keratoses) turn into skin cancer within one year if they are not treated. It is important to have all of these growths evaluated to determine the best treatment approach. What are the causes? This condition is caused by getting too much ultraviolet (UV) radiation from the sun or other UV light sources. What increases the risk? The following factors may make you more likely to develop this condition:  Having light-colored skin and blue eyes.  Having blonde or red hair.  Spending a lot of time in the sun.  Inadequate skin protection when outdoors. This may include: ? Not using sunscreen properly. ? Not covering up skin that is exposed to sunlight.  Aging. The risk of developing an actinic keratosis increases with age.  What are the signs or symptoms? Actinic keratoses look like scaly, rough spots of skin.They can be as small as a pinhead or as big as a quarter. They may itch, hurt, or feel sensitive. In most cases, the growths become red. In some cases, they may be skin-colored, light tan, dark tan, pink, or a combination of any of these colors. There may be a small piece of pink or gray skin (skin tag) growing from the actinic keratosis. In some cases, it may be easier to notice actinic keratoses by feeling them, rather than seeing them. Actinic keratoses appear most often on areas of skin that get a lot of sun exposure, including the scalp, face, ears, lips, upper back, forearms, and the backs of the hands. Sometimes, actinic keratoses disappear, but many reappear a few days to a few weeks later. How is this diagnosed? This condition is usually diagnosed with a physical exam. A tissue sample may be removed from the actinic keratosis and examined  under a microscope (biopsy). How is this treated?  Treatment for this condition may include:  Scraping off the actinic keratosis (curettage).  Freezing the actinic keratosis with liquid nitrogen (cryosurgery). This causes the growth to eventually fall off the skin.  Applying medicated creams or gels to destroy the cells in the growth.  Applying chemicals to the actinic keratosis to make the outer layers of skin peel off (chemical peel).  Photodynamic therapy. In this procedure, medicated cream is applied to the actinic keratosis. This cream increases your skin's sensitivity to light. Then, a strong light is aimed at the actinic keratosis to destroy cells in the growth.  Follow these instructions at home: Skin care  Apply cool, wet cloths (cool compresses) to the affected areas.  Do not scratch your skin.  Check your skin regularly for any growths, especially growths that: ? Start to itch or bleed. ? Change in size, shape, or color. Caring for the treated area  Keep the treated area clean and dry as told by your health care provider.  Do not apply any medicine, cream, or lotion to the treated area unless your health care provider tells you to do that.  Do not pick at blisters or try to break them open. This can cause infection and scarring.  If you have red or irritated skin after treatment, follow instructions from your health care provider about how to take care of the treated area. Make sure you: ? Wash your hands with soap and water before  you change your bandage (dressing). If soap and water are not available, use hand sanitizer. ? Change your dressing as told by your health care provider.  If you have red or irritated skin after treatment, check your treated area every day for signs of infection. Check for: ? Swelling, pain, or more redness. ? Fluid or blood. ? Warmth. ? Pus or a bad smell. General instructions  Take over-the-counter and prescription medicines only as  told by your health care provider.  Return to your normal activities as told by your health care provider. Ask your health care provider what activities are safe for you.  Do not use any tobacco products, such as cigarettes, chewing tobacco, and e-cigarettes. If you need help quitting, ask your health care provider.  Have a skin exam done every year by a health care provider who is a skin conditions specialist (dermatologist).  Keep all follow-up visits as told by your health care provider. This is important. How is this prevented?  Do not get sunburns.  Try to avoid the sun between 10:00 a.m. and 4:00 p.m. This is when the UV light is the strongest.  Use a sunscreen or sunblock with SPF 30 (sun protection factor 30) or greater.  Apply sunscreen before you are exposed to sunlight, and reapply periodically as often as directed by the instructions on the sunscreen container.  Always wear sunglasses that have UV protection, and always wear hats and clothing to protect your skin from sunlight.  When possible, avoid medicines that increase your sensitivity to sunlight. These include: ? Certain antibiotic medicines. ? Certain water pills (diuretics). ? Certain prescription medicines that are used to treat acne (retinoids).  Do not use tanning beds or other indoor tanning devices. Contact a health care provider if:  You notice any changes or new growths on your skin.  You have swelling, pain, or more redness around your treated area.  You have fluid or blood coming from your treated area.  Your treated area feels warm to the touch.  You have pus or a bad smell coming from your treated area.  You have a fever.  You have a blister that becomes large and painful. This information is not intended to replace advice given to you by your health care provider. Make sure you discuss any questions you have with your health care provider. Document Released: 10/11/2008 Document Revised:  03/15/2016 Document Reviewed: 03/25/2015 Elsevier Interactive Patient Education  2018 [place] Mole A mole is a colored (pigmented) growth on the skin. Moles are very common. They are usually harmless, but some moles can become cancerous over time. What are the causes? Moles occur when pigmented skin cells grow together in clusters instead of spreading out in the skin as they normally do. The reason why the skin cells grow together in clusters is not known. What are the signs or symptoms? A mole may be:  [place]ark or black.  Flat or raised.  Smooth or wrinkled.  How is this diagnosed? A mole is diagnosed with a skin exam. If your health care provider thinks a mole may be cancerous, a piece of the mole will be removed for testing. How is this treated? Treatment is not needed unless a mole is cancerous. If a mole is cancerous, it will be removed. If a mole is causing pain or you do not like the way it looks, you may choose to have it removed. Follow these instructions at home:  Every month, look for new moles and  check your existing moles for changes. This is important because a change in a mole can mean that the mole has become cancerous. Look for changes in: ? Size. Look for moles that are more than  in (0.64 cm) wide (in diameter). ? Shape. Look for moles that are not round or oval. ? Borders. Look for moles that are not symmetrical. ? Color. Note that it is normal for moles to get darker during pregnancy or when you take birth control pills.  When you are outdoors, wear sunscreen with SPF 30 (sun protection factor 30) or higher. Reapply the sunscreen every 2-3 hours.  If you have a large number of moles, see a skin doctor (dermatologist) at least one time every year. Contact a health care provider if:  The size, shape, borders, or color of your mole change.  Your mole, or the skin near the mole, becomes painful, sore, red, or swollen.  Your mole: ? Develops more than one  color. ? Itches or bleeds. ? Becomes scaly, sheds skin, or oozes fluid. ? Becomes flat or develops raised areas. ? Becomes hard or soft.  You develop a new mole. This information is not intended to replace advice given to you by your health care provider. Make sure you discuss any questions you have with your health care provider. Document Released: 04/09/2001 Document Revised: 12/27/2015 Document Reviewed: 05/05/2015 Elsevier Interactive Patient Education  [employer].

## 2017-06-02 ENCOUNTER — Telehealth: Payer: Self-pay | Admitting: *Deleted

## 2017-06-02 DIAGNOSIS — L989 Disorder of the skin and subcutaneous tissue, unspecified: Secondary | ICD-10-CM

## 2017-06-02 NOTE — Telephone Encounter (Signed)
Spoken and notified patient of Regina's comments. Patient verbalized understanding.

## 2017-06-02 NOTE — Addendum Note (Signed)
Addended by: Jearld Fenton on: 06/02/2017 01:10 PM   Modules accepted: Orders

## 2017-06-02 NOTE — Telephone Encounter (Signed)
Referral to derm placed.

## 2017-06-02 NOTE — Telephone Encounter (Signed)
Copied from [place] [id number]. Topic: Referral - Request >> Jun 02, 2017 10:23 AM Conception Chancy, NT wrote: Reason for CRM: pt seen a NP @ [facility]. She found 2 suspicious spots on her back and she recommends she see a dermatologist but she needs a referral.

## 2017-06-04 NOTE — Telephone Encounter (Signed)
noted 

## 2019-09-06 ENCOUNTER — Encounter: Payer: Self-pay | Admitting: Emergency Medicine

## 2019-09-06 ENCOUNTER — Other Ambulatory Visit: Payer: Self-pay

## 2019-09-06 ENCOUNTER — Emergency Department
Admission: EM | Admit: 2019-09-06 | Discharge: 2019-09-07 | Disposition: A | Discharge status: Home / Self Care | Payer: Medicare Other | Attending: Emergency Medicine | Admitting: Emergency Medicine

## 2019-09-06 ENCOUNTER — Emergency Department: Payer: Medicare Other

## 2019-09-06 DIAGNOSIS — F909 Attention-deficit hyperactivity disorder, unspecified type: Secondary | ICD-10-CM | POA: Insufficient documentation

## 2019-09-06 DIAGNOSIS — R0602 Shortness of breath: Secondary | ICD-10-CM | POA: Diagnosis present

## 2019-09-06 DIAGNOSIS — R079 Chest pain, unspecified: Secondary | ICD-10-CM | POA: Insufficient documentation

## 2019-09-06 DIAGNOSIS — R002 Palpitations: Secondary | ICD-10-CM | POA: Diagnosis not present

## 2019-09-06 DIAGNOSIS — R739 Hyperglycemia, unspecified: Secondary | ICD-10-CM | POA: Diagnosis not present

## 2019-09-06 DIAGNOSIS — Z79899 Other long term (current) drug therapy: Secondary | ICD-10-CM | POA: Insufficient documentation

## 2019-09-06 LAB — CBC
HCT: 40.3 % (ref 36.0–46.0)
Hemoglobin: 13.1 g/dL (ref 12.0–15.0)
MCH: 27.9 pg (ref 26.0–34.0)
MCHC: 32.5 g/dL (ref 30.0–36.0)
MCV: 85.9 fL (ref 80.0–100.0)
Platelets: 285 10*3/uL (ref 150–400)
RBC: 4.69 MIL/uL (ref 3.87–5.11)
RDW: 12.6 % (ref 11.5–15.5)
WBC: 5 10*3/uL (ref 4.0–10.5)
nRBC: 0 % (ref 0.0–0.2)

## 2019-09-06 LAB — BASIC METABOLIC PANEL
Anion gap: 9 (ref 5–15)
BUN: 14 mg/dL (ref 8–23)
CO2: 24 mmol/L (ref 22–32)
Calcium: 8.8 mg/dL — ABNORMAL LOW (ref 8.9–10.3)
Chloride: 104 mmol/L (ref 98–111)
Creatinine, Ser: 0.88 mg/dL (ref 0.44–1.00)
GFR calc Af Amer: >60 mL/min (ref >60)
GFR calc non Af Amer: >60 mL/min (ref >60)
Glucose, Bld: 152 mg/dL — ABNORMAL HIGH (ref 70–99)
Potassium: 3.8 mmol/L (ref 3.5–5.1)
Sodium: 137 mmol/L (ref 135–145)

## 2019-09-06 LAB — TROPONIN I (HIGH SENSITIVITY)
Troponin I (High Sensitivity): 4 ng/L (ref <18)
Troponin I (High Sensitivity): 3 ng/L (ref <18)

## 2019-09-06 LAB — MAGNESIUM: Magnesium: 2.3 mg/dL (ref 1.7–2.4)

## 2019-09-06 MED ORDER — SODIUM CHLORIDE 0.9% FLUSH: 3.0000 mL | Freq: Once | INTRAVENOUS | Status: DC

## 2019-09-06 NOTE — ED Provider Notes (Signed)
[facility] Emergency Department Provider Note  ____________________________________________  Time seen: Approximately 11:45 PM  I have reviewed the triage vital signs and the nursing notes.   HISTORY  Chief Complaint Shortness of Breath and Chest Pain   HPI Karen Prince is a 64 y.o. female with a history of MVP, PVCs, anemia, IBS who presents for evaluation of palpitations.  Patient reports that she has had intermittent episodes of palpitations over the years.  Usually a couple ear lasting 5 to 10 minutes at a time.  Last week she had an episode lasting about 45 minutes.  Today she has had intermittent episodes lasting most of the day.  She reports that she can feel her heart skipping and when that happens she has a squeezing sensation in her chest.  She reports that the episodes usually get better when she walks around.  She denies any personal or family history of heart attacks, history of smoking, history of blood clots, recent travel immobilization, leg pain or swelling, hemoptysis, exogenous hormones.  She is asymptomatic at this time.  She denies any recent illness, no vomiting or diarrhea, no fever or chills, no cough.   Past Medical History:  Diagnosis Date  . ADHD (attention deficit hyperactivity disorder)   . Anemia   . Chicken pox   . Depression   . Essential tremor   . Hearing loss    follows with audologist  . IBS (irritable bowel syndrome)   . Insomnia   . Leucopenia    has blood draw every 5 years  . Measles   . Mitral valve prolapse   . PVC (premature ventricular contraction)     Patient Active Problem List   Diagnosis Date Noted  . Urinary incontinence 06/28/2016  . Preventative health care 06/28/2016  . Insomnia 10/26/2015  . Depression 04/28/2015  . Attention deficit hyperactivity disorder 04/28/2015    Past Surgical History:  Procedure Laterality Date  . COLONOSCOPY WITH PROPOFOL N/A 01/04/2016   Procedure: COLONOSCOPY  WITH PROPOFOL;  Surgeon: Manya Silvas, MD;  Location: [facility] ENDOSCOPY;  Service: Endoscopy;  Laterality: N/A;  . INGUINAL HERNIA REPAIR Right 1984   surgery performed to remove endometrial mass and hernai was found secondary  . KNEE ARTHROSCOPY Right 1993    Prior to Admission medications   Medication Sig Start Date End Date Taking? Authorizing Provider  buPROPion (WELLBUTRIN SR) 150 MG 12 hr tablet Take 150 mg by mouth 2 (two) times daily.     [provider]  CALCIUM PO Take 860 mg by mouth daily.    [provider]  Cholecalciferol (VITAMIN D3) 2000 UNITS capsule Take 2,000 Units by mouth every other day.     [provider]  DULoxetine (CYMBALTA) 60 MG capsule Take 60 mg by mouth daily.  09/04/15   [provider]  zaleplon (SONATA) 10 MG capsule Take by mouth.    [provider]    Allergies Patient has no known allergies.  Family History  Problem Relation Age of Onset  . Hypertension Father   . Macular degeneration Father   . Heart disease Father   . Emphysema Father   . Melanoma Mother   . Anuerysm Mother        Abdominal  . Emphysema Mother     Social History Social History   Tobacco Use  . Smoking status: Never Smoker  . Smokeless tobacco: Never Used  Substance Use Topics  . Alcohol use: Yes  Alcohol/week: 0.0 standard drinks    Comment: occ  . Drug use: No    Review of Systems  Constitutional: Negative for fever. Eyes: Negative for visual changes. ENT: Negative for sore throat. Neck: No neck pain  Cardiovascular: + chest tightness and palpitations Respiratory: Negative for shortness of breath. Gastrointestinal: Negative for abdominal pain, vomiting or diarrhea. Genitourinary: Negative for dysuria. Musculoskeletal: Negative for back pain. Skin: Negative for rash. Neurological: Negative for headaches, weakness or numbness. Psych: No SI or HI  ____________________________________________   PHYSICAL  EXAM:  VITAL SIGNS: ED Triage Vitals [09/06/19 2111]  Enc Vitals Group     BP 122/77     Pulse Rate 81     Resp 16     Temp 98.5 F (36.9 C)     Temp Source Oral     SpO2 97 %     Weight 190 lb (86.2 kg)     Height 5\' 9"  (1.753 m)     Head Circumference      Peak Flow      Pain Score 0     Pain Loc      Pain Edu?      Excl. in [place]?     Constitutional: Alert and oriented. Well appearing and in no apparent distress. HEENT:      Head: Normocephalic and atraumatic.         Eyes: Conjunctivae are normal. Sclera is non-icteric.       Mouth/Throat: Mucous membranes are moist.       Neck: Supple with no signs of meningismus. Cardiovascular: Regular rate and rhythm. No murmurs, gallops, or rubs. 2+ symmetrical distal pulses are present in all extremities. No JVD. Respiratory: Normal respiratory effort. Lungs are clear to auscultation bilaterally. No wheezes, crackles, or rhonchi.  Gastrointestinal: Soft, non tender, and non distended with positive bowel sounds. No rebound or guarding. Musculoskeletal: Nontender with normal range of motion in all extremities. No edema, cyanosis, or erythema of extremities. Neurologic: Normal speech and language. Face is symmetric. Moving all extremities. No gross focal neurologic deficits are appreciated. Skin: Skin is warm, dry and intact. No rash noted. Psychiatric: Mood and affect are normal. Speech and behavior are normal.  ____________________________________________   LABS (all labs ordered are listed, but only abnormal results are displayed)  Labs Reviewed  BASIC METABOLIC PANEL - Abnormal; Notable for the following components:      Result Value   Glucose, Bld 152 (*)    Calcium 8.8 (*)    All other components within normal limits  CBC  MAGNESIUM  TROPONIN I (HIGH SENSITIVITY)  TROPONIN I (HIGH SENSITIVITY)   ____________________________________________  EKG  ED ECG REPORT I, Rudene Re, the attending physician, personally  viewed and interpreted this ECG.  Normal sinus rhythm, rate of 80, normal intervals, normal axis, LVH with repol abnormalities, no ST elevations.  No prior for comparison.  23:51 -normal sinus rhythm, rate of 68, normal intervals, normal axis, LVH, no ST elevations.  Unchanged from prior ____________________________________________  RADIOLOGY  I have personally reviewed the images performed during this visit and I agree with the Radiologist's read.   Interpretation by Radiologist:  DG Chest 2 View  Result Date: 09/06/2019 CLINICAL DATA:  Arrhythmia EXAM: CHEST - 2 VIEW COMPARISON:  None. FINDINGS: Heart and mediastinal contours are within normal limits. No focal opacities or effusions. No acute bony abnormality. IMPRESSION: No active cardiopulmonary disease. Electronically Signed   By: Rolm Baptise M.D.   On: 09/06/2019 21:28  ____________________________________________   PROCEDURES  Procedure(s) performed: None Procedures Critical Care performed:  None ____________________________________________   INITIAL IMPRESSION / ASSESSMENT AND PLAN / ED COURSE  64 y.o. female with a history of MVP, PVCs, anemia, IBS who presents for evaluation of palpitations.  Patient is extremely well-appearing in no distress with normal vital signs, heart regular rate and rhythm with no murmurs appreciated, lungs are clear to auscultation, pulses are strong and equal in all 4 extremities, there is no asymmetric leg swelling.  EKG x2 showing LVH with no dysrhythmias.  Labs with no electrolyte abnormalities, high-sensitivity troponin x2 - for any signs of ischemia.  Labs otherwise with no significant dehydration, AKI, or anemia.  Chest x-ray negative with no signs of pneumonia or edema.  Patient does have a slightly elevated blood glucose but no history of diabetes.  This is a nonfasting result.  Recommended follow-up with PCP for reevaluation of hemoglobin A1c and fasting blood glucose.  Patient was  monitored on telemetry for several hours with no obvious dysrhythmias.  Remains asymptomatic.  Differential diagnosis including PVCs versus more malignant dysrhythmias such as A. fib, a flutter, V. tach, SVT.  With normal EKGs, normal telemetry monitoring, normal labs, patient is clear for outpatient follow-up.  Will refer to cardiology for Holter monitoring.  Discussed my standard return precautions for recurrence of her symptoms, chest pain, syncope.       As part of my medical decision making, I reviewed the following data within the [place] notes reviewed and incorporated, Labs reviewed , EKG interpreted , Old chart reviewed, Radiograph reviewed , Notes from prior ED visits and [place] Controlled Substance Database   Please note:  Patient was evaluated in Emergency Department today for the symptoms described in the history of present illness. Patient was evaluated in the context of the global COVID-19 pandemic, which necessitated consideration that the patient might be at risk for infection with the SARS-CoV-2 virus that causes COVID-19. Institutional protocols and algorithms that pertain to the evaluation of patients at risk for COVID-19 are in a state of rapid change based on information released by regulatory bodies including the CDC and federal and state organizations. These policies and algorithms were followed during the patient's care in the ED.  Some ED evaluations and interventions may be delayed as a result of limited staffing during the pandemic.   ____________________________________________   FINAL CLINICAL IMPRESSION(S) / ED DIAGNOSES   Final diagnoses:  Palpitations      NEW MEDICATIONS STARTED DURING THIS VISIT:  ED Discharge Orders    None       Note:  This document was prepared using Dragon voice recognition software and may include unintentional dictation errors.    Alfred Levins, [place], MD 09/07/19 Shelah Lewandowsky

## 2019-09-06 NOTE — ED Triage Notes (Signed)
Pt to ED from home c/o feeling like heart is in an arrhythmia that started today with hx of same.  States feels extra beats or sometimes like heart skips beat.  States some SOB with mild chest heaviness, denies cardiac surgeries, denies blood thinners. Presents A&Ox4, chest rise even and unlabored, in NAD at this time.

## 2019-09-06 NOTE — ED Notes (Signed)
Patient reports history of PVC's and close follow up with PCP. Patient states this has occurred in past where she feels like her heart is beating abnormally but reports this is occurring since 1PM today where in past only lasted 5 minutes. Patient denies pain at this time. Patient placed in bed with call light in reach and in position of comfort. Door closed for privacy

## 2019-09-07 NOTE — Discharge Instructions (Signed)
Make sure to follow up with Cardiology for Holter monitoring and your PCP for further evaluation of your elevated blood glucose.

## 2019-09-08 ENCOUNTER — Ambulatory Visit (INDEPENDENT_AMBULATORY_CARE_PROVIDER_SITE_OTHER): Payer: Medicare Other | Admitting: Primary Care

## 2019-09-08 ENCOUNTER — Encounter: Payer: Self-pay | Admitting: Primary Care

## 2019-09-08 ENCOUNTER — Other Ambulatory Visit: Payer: Self-pay

## 2019-09-08 VITALS — BP 126/80 | HR 82 | Temp 96.6°F | Ht 69.0 in | Wt 196.0 lb

## 2019-09-08 DIAGNOSIS — R739 Hyperglycemia, unspecified: Secondary | ICD-10-CM

## 2019-09-08 DIAGNOSIS — Z13220 Encounter for screening for lipoid disorders: Secondary | ICD-10-CM

## 2019-09-08 DIAGNOSIS — R002 Palpitations: Secondary | ICD-10-CM | POA: Diagnosis not present

## 2019-09-08 LAB — LIPID PANEL
Cholesterol: 270 mg/dL — ABNORMAL HIGH (ref 0–200)
HDL: 74.3 mg/dL (ref >39.00)
LDL Cholesterol: 178 mg/dL — ABNORMAL HIGH (ref 0–99)
NonHDL: 196.04
Total CHOL/HDL Ratio: 4
Triglycerides: 92 mg/dL (ref 0.0–149.0)
VLDL: 18.4 mg/dL (ref 0.0–40.0)

## 2019-09-08 LAB — TSH: TSH: 0.98 u[IU]/mL (ref 0.35–4.50)

## 2019-09-08 LAB — HEMOGLOBIN A1C: Hgb A1c MFr Bld: 5.6 % (ref 4.6–6.5)

## 2019-09-08 NOTE — Progress Notes (Signed)
Subjective:    Patient ID: Karen Prince, female    DOB: 04-17-1956, 63 y.o.   MRN: [id number]  HPI  This visit occurred during the SARS-CoV-2 public health emergency.  Safety protocols were in place, including screening questions prior to the visit, additional usage of staff PPE, and extensive cleaning of exam room while observing appropriate contact time as indicated for disinfecting solutions.   Karen Prince is a 64 year old female who presents today to re-establish care and for ED follow up.  1) ADHD/Depression/Insomnia: Currently managed on duloxetine 60 mg, zaleplon 10 mg PRN, bupropion SR 150 mg. She is following with psychiatry.   2) Palpitations: She presented to [facility] on [facility]/06/19 with complaints of chest pain and shortness of breath. She endorsed intermittent palpitations over the years, lasting 5-10 minutes. Earlier that day she had longer lasting palpitations so she went in for evaluation.   During her stay in the ED she underwent ECG x 2 which showed LVH without dysrhythmia. Labs without abnormality including negative Troponin. Chest xray without pneumonia or edema. Blood sugar was elevated so it was recommended she follow up with PCP. She was monitored on telemetry for hours which was without dsyrhythmias. Symptoms were suspected to be secondary to PVC's. She was discharged home later that day with recommendations for cardiology and PCP follow up.  Since her discharge home she has an appointment with cardiology for later this month. She denies chest pain, dizziness. No palpitations since her ED visit.   BP Readings from Last 3 Encounters:  09/08/19 126/80  09/07/19 (!) 149/85  05/29/17 140/88     Review of Systems  Respiratory: Negative for shortness of breath.   Cardiovascular: Positive for palpitations. Negative for chest pain.  Neurological: Negative for dizziness and headaches.       Past Medical History:  Diagnosis Date  . ADHD (attention deficit hyperactivity  disorder)   . Anemia   . Chicken pox   . Depression   . Essential tremor   . Hearing loss    follows with audologist  . IBS (irritable bowel syndrome)   . Insomnia   . Leucopenia    has blood draw every 5 years  . Measles   . Mitral valve prolapse   . Palpitations   . PVC (premature ventricular contraction)   . Urinary incontinence      Social History   Socioeconomic History  . Marital status: Single    Spouse name: Not on file  . Number of children: Not on file  . Years of education: Not on file  . Highest education level: Not on file  Occupational History  . Not on file  Tobacco Use  . Smoking status: Never Smoker  . Smokeless tobacco: Never Used  Substance and Sexual Activity  . Alcohol use: Yes    Alcohol/week: 0.0 standard drinks    Comment: occ  . Drug use: No  . Sexual activity: Yes    Birth control/protection: Post-menopausal  Other Topics Concern  . Not on file  Social History Narrative   Single, has a partner.   Works as a Professor of Physical Therapy.   Works at [employer].   Enjoys photography, travel, being outdoors, relaxing.   Social Determinants of Health   Financial Resource Strain:   . Difficulty of Paying Living Expenses: Not on file  Food Insecurity:   . Worried About Charity fundraiser in the Last Year: Not on file  . Ran Out of Food in  the Last Year: Not on file  Transportation Needs:   . Lack of Transportation (Medical): Not on file  . Lack of Transportation (Non-Medical): Not on file  Physical Activity:   . Days of Exercise per Week: Not on file  . Minutes of Exercise per Session: Not on file  Stress:   . Feeling of Stress : Not on file  Social Connections:   . Frequency of Communication with Friends and Family: Not on file  . Frequency of Social Gatherings with Friends and Family: Not on file  . Attends Religious Services: Not on file  . Active Member of Clubs or Organizations: Not on file  . Attends Archivist Meetings:  Not on file  . Marital Status: Not on file  Intimate Partner Violence:   . Fear of Current or Ex-Partner: Not on file  . Emotionally Abused: Not on file  . Physically Abused: Not on file  . Sexually Abused: Not on file    Past Surgical History:  Procedure Laterality Date  . COLONOSCOPY WITH PROPOFOL N/A 01/04/2016   Procedure: COLONOSCOPY WITH PROPOFOL;  Surgeon: Manya Silvas, MD;  Location: [facility] ENDOSCOPY;  Service: Endoscopy;  Laterality: N/A;  . INGUINAL HERNIA REPAIR Right 1984   surgery performed to remove endometrial mass and hernai was found secondary  . KNEE ARTHROSCOPY Right 1993    Family History  Problem Relation Age of Onset  . Hypertension Father   . Macular degeneration Father   . Heart disease Father   . Emphysema Father   . Melanoma Mother   . Anuerysm Mother        Abdominal  . Emphysema Mother     No Known Allergies  Current Outpatient Medications on File Prior to Visit  Medication Sig Dispense Refill  . buPROPion (WELLBUTRIN SR) 150 MG 12 hr tablet Take 150 mg by mouth 2 (two) times daily.     Marland Kitchen CALCIUM PO Take 860 mg by mouth daily.    . Cholecalciferol (VITAMIN D3) 2000 UNITS capsule Take 2,000 Units by mouth every other day.     . DULoxetine (CYMBALTA) 60 MG capsule Take 60 mg by mouth daily.   0  . Melatonin 3 MG TABS Take 3 mg by mouth.    . zaleplon (SONATA) 10 MG capsule Take by mouth.     No current facility-administered medications on file prior to visit.    BP 126/80   Pulse 82   Temp (!) 96.6 F (35.9 C) (Temporal)   Ht 5\' 9"  (1.753 m)   Wt 196 lb (88.9 kg)   SpO2 96%   BMI 28.94 kg/m    Objective:   Physical Exam  Constitutional: She appears well-nourished.  Cardiovascular: Normal rate and regular rhythm.  Respiratory: Effort normal and breath sounds normal.  Musculoskeletal:     Cervical back: Neck supple.  Skin: Skin is warm and dry.  Psychiatric: She has a normal mood and affect.           Assessment & Plan:

## 2019-09-08 NOTE — Assessment & Plan Note (Addendum)
Chronic and intermittent for years. Recent ED evaluation without obvious cause.  Add TSH, A1C, Lipid panel today. Will have her follow up with cardiology as scheduled.  Ed notes, labs, imaging reviewed.

## 2019-09-08 NOTE — Patient Instructions (Addendum)
Stop by the lab prior to leaving today. I will notify you of your results once received.   Follow up with cardiology as scheduled.  It was a pleasure to see you today! Welcome back!

## 2019-09-19 NOTE — Progress Notes (Signed)
Cardiology Office Note  Date:  09/20/2019   ID:  Karen Prince, DOB 1956-05-17, MRN [id number]  PCP:  Pleas Koch, NP   Chief Complaint  Patient presents with  . Follow-up    [facility] ER; arrhythmia. Former Dr. Aron Baba patient for [employer]. Meds reviewed by the pt. verbally. Pt. c/o some difficulty breathing with feeling the need to take a deep breath.     HPI:  Karen Prince is a 64 y.o. 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   episodes usually get better when she walks around.    Tried year without caffeine Then had a bad episode of arrhythmia, did not seem to help that much  Labs reviewed Total chol 270,LDL 178 HBA1C 5.6  Prior records reviewed Stress test 2007: low risk  EKG personally reviewed by myself on todays visit Shows NSR rate 82   Does regular walking, asymptomatic   PMH:   has a past medical history of ADHD (attention deficit hyperactivity disorder), Anemia, Chicken pox, Depression, Essential tremor, Hearing loss, IBS (irritable bowel syndrome), Insomnia, Leucopenia, Measles, Mitral valve prolapse, Palpitations, PVC (premature ventricular contraction), and Urinary incontinence.  PSH:    Past Surgical History:  Procedure Laterality Date  . COLONOSCOPY WITH PROPOFOL N/A 01/04/2016   Procedure: COLONOSCOPY WITH PROPOFOL;  Surgeon: Manya Silvas, MD;  Location: [facility] ENDOSCOPY;  Service: Endoscopy;  Laterality: N/A;  . INGUINAL HERNIA REPAIR Right 1984    surgery performed to remove endometrial mass and hernai was found secondary  . KNEE ARTHROSCOPY Right 1993    Current Outpatient Medications  Medication Sig Dispense Refill  . buPROPion (WELLBUTRIN SR) 150 MG 12 hr tablet Take 150 mg by mouth 2 (two) times daily.     Marland Kitchen CALCIUM PO Take 860 mg by mouth daily.    . Cholecalciferol (VITAMIN D3) 2000 UNITS capsule Take 2,000 Units by mouth every other day.     . DULoxetine (CYMBALTA) 60 MG capsule Take 60 mg by mouth daily.   0  . Melatonin 3 MG TABS Take 3 mg by mouth.    . Omega-3 Fatty Acids (FISH OIL) 1000 MG CAPS Take by mouth daily.    . vitamin B-12 (CYANOCOBALAMIN) 100 MCG tablet Take 100 mcg by mouth daily.    . zaleplon (SONATA) 10 MG capsule Take by mouth.     No current facility-administered medications for this visit.     Allergies:   Patient has no known allergies.   Social History:  The patient  reports that she has never smoked. She has never used smokeless tobacco. She reports current alcohol use. She reports that she does not use drugs.   Family History:   family history includes Anuerysm in her mother; Emphysema in her father and mother; Heart disease in her father; Hypertension in her father; Macular degeneration in her father; Melanoma in her mother.    Review of Systems: Review of Systems  Constitutional: Negative.   HENT: Negative.   Respiratory: Negative.  Cardiovascular: Positive for palpitations.  Gastrointestinal: Negative.   Musculoskeletal: Negative.   Neurological: Negative.   Psychiatric/Behavioral: Negative.   All other systems reviewed and are negative.   PHYSICAL EXAM: VS:  BP 140/84 (BP Location: Right Arm, Patient Position: Sitting, Cuff Size: Normal)   Pulse 82   Ht 5\' 9"  (1.753 m)   Wt 197 lb 8 oz (89.6 kg)   SpO2 98%   BMI 29.17 kg/m  , BMI Body mass index is 29.17 kg/m. GEN: Well nourished, well developed, in no acute distress HEENT: normal Neck: no JVD, carotid bruits, or  masses Cardiac: RRR; no murmurs, rubs, or gallops,no edema  Respiratory:  clear to auscultation bilaterally, normal work of breathing GI: soft, nontender, nondistended, + BS MS: no deformity or atrophy Skin: warm and dry, no rash Neuro:  Strength and sensation are intact Psych: euthymic mood, full affect   Recent Labs: 09/06/2019: BUN 14; Creatinine, Ser 0.88; Hemoglobin 13.1; Magnesium 2.3; Platelets 285; Potassium 3.8; Sodium 137 09/08/2019: TSH 0.98    Lipid Panel Lab Results  Component Value Date   CHOL 270 (H) 09/08/2019   HDL 74.30 09/08/2019   LDLCALC 178 (H) 09/08/2019   TRIG 92.0 09/08/2019      Wt Readings from Last 3 Encounters:  09/20/19 197 lb 8 oz (89.6 kg)  09/08/19 196 lb (88.9 kg)  09/06/19 190 lb (86.2 kg)      ASSESSMENT AND PLAN:  Problem List Items Addressed This Visit    Palpitations - Primary   Relevant Orders   EKG 12-Lead   CT CARDIAC SCORING    Other Visit Diagnoses    Mixed hyperlipidemia       PVC (premature ventricular contraction)         PVC Causing her palpitations Rare episodes, sometimes 3 times a year Does not want medications at this time Discussed zio monitor for worsening sx  Hyperlipidemia Long discussion today, has been reluctant to be on medications in the past Some family history Father with coronary disease She is not having typical anginal symptoms Discussed a screening study, CT coronary calcium scoring has been ordered This can perhaps help guide lipid management in the future   Disposition:   F/U as needed   Total encounter time more than 25 minutes  Greater than 50% was spent in counseling and coordination of care with the patient   Signed, Esmond Plants, M.D., Ph.D. [place], [place]

## 2019-09-20 ENCOUNTER — Other Ambulatory Visit: Payer: Self-pay

## 2019-09-20 ENCOUNTER — Encounter: Payer: Self-pay | Admitting: Cardiovascular Disease

## 2019-09-20 ENCOUNTER — Ambulatory Visit (INDEPENDENT_AMBULATORY_CARE_PROVIDER_SITE_OTHER): Payer: Medicare Other | Admitting: Cardiovascular Disease

## 2019-09-20 VITALS — BP 140/84 | HR 82 | Ht 69.0 in | Wt 197.5 lb

## 2019-09-20 DIAGNOSIS — I493 Ventricular premature depolarization: Secondary | ICD-10-CM

## 2019-09-20 DIAGNOSIS — E782 Mixed hyperlipidemia: Secondary | ICD-10-CM | POA: Diagnosis not present

## 2019-09-20 DIAGNOSIS — R002 Palpitations: Secondary | ICD-10-CM

## 2019-09-20 NOTE — Patient Instructions (Addendum)
Medication Instructions:  No changes  If you need a refill on your cardiac medications before your next appointment, please call your pharmacy.    Lab work: No new labs needed   If you have labs (blood work) drawn today and your tests are completely normal, you will receive your results only by: Marland Kitchen MyChart Message (if you have MyChart) OR . A paper copy in the mail If you have any lab test that is abnormal or we need to change your treatment, we will call you to review the results.   Testing/Procedures: We will order CT coronary calcium score $150   Please call [phone] to schedule     [facility]  [address], [place] [address]   Follow-Up: At [facility], you and your health needs are our priority.  As part of our continuing mission to provide you with exceptional heart care, we have created designated Provider Care Teams.  These Care Teams include your primary Cardiologist (physician) and Advanced Practice Providers (APPs -  Physician Assistants and Nurse Practitioners) who all work together to provide you with the care you need, when you need it.  . You will need a follow up appointment in 12 months .   Please call our office 2 months in advance to schedule this appointment.    . Providers on your designated Care Team:   . Murray Hodgkins, NP . Christell Faith, PA-C . Marrianne Mood, PA-C  Any Other Special Instructions Will Be Listed Below (If Applicable).  For educational health videos Log in to : [email or web address] Or : [email or web address], password : [email or web address]

## 2019-10-07 ENCOUNTER — Other Ambulatory Visit: Payer: Self-pay

## 2019-10-07 ENCOUNTER — Ambulatory Visit (INDEPENDENT_AMBULATORY_CARE_PROVIDER_SITE_OTHER)
Admission: RE | Admit: 2019-10-07 | Discharge: 2019-10-07 | Disposition: A | Discharge status: Home / Self Care | Payer: Self-pay | Source: Ambulatory Visit | Attending: Cardiovascular Disease | Admitting: Cardiovascular Disease

## 2019-10-07 DIAGNOSIS — R002 Palpitations: Secondary | ICD-10-CM

## 2019-10-13 DIAGNOSIS — F411 Generalized anxiety disorder: Secondary | ICD-10-CM | POA: Diagnosis not present

## 2019-10-13 DIAGNOSIS — F902 Attention-deficit hyperactivity disorder, combined type: Secondary | ICD-10-CM | POA: Diagnosis not present

## 2019-10-13 DIAGNOSIS — F3342 Major depressive disorder, recurrent, in full remission: Secondary | ICD-10-CM | POA: Diagnosis not present

## 2019-12-01 DIAGNOSIS — Z20828 Contact with and (suspected) exposure to other viral communicable diseases: Secondary | ICD-10-CM | POA: Diagnosis not present

## 2019-12-08 ENCOUNTER — Other Ambulatory Visit: Payer: Self-pay | Admitting: Primary Care

## 2019-12-08 ENCOUNTER — Other Ambulatory Visit: Payer: Self-pay | Admitting: Orthopedic Surgery

## 2019-12-08 DIAGNOSIS — Z1231 Encounter for screening mammogram for malignant neoplasm of breast: Secondary | ICD-10-CM

## 2019-12-10 ENCOUNTER — Ambulatory Visit
Admission: RE | Admit: 2019-12-10 | Discharge: 2019-12-10 | Disposition: A | Discharge status: Home / Self Care | Payer: Medicare Other | Source: Ambulatory Visit | Attending: Primary Care | Admitting: Primary Care

## 2019-12-10 DIAGNOSIS — Z1231 Encounter for screening mammogram for malignant neoplasm of breast: Secondary | ICD-10-CM | POA: Insufficient documentation

## 2019-12-16 NOTE — Progress Notes (Signed)
PCP: Pleas Koch, NP   Chief Complaint  Patient presents with  . Gynecologic Exam    HPI:      Ms. Karen Prince is a 64 y.o. No obstetric history on file. whose LMP was No LMP recorded. Patient is postmenopausal., presents today for her NP> 3 yrs annual examination.  Her menses are absent due to menopause. She denies PMB. She has occas vasomotor sx.   Sex activity: single partner, contraception - post menopausal status. She does not have vaginal dryness.  Last Pap: 10/12/15  Results were: no abnormalities /neg HPV DNA. No hx of abn paps in past.   Last mammogram: 12/10/19  Results were: normal--routine follow-up in 12 months  There is a FH of breast cancer in her PGM, genetic testing not indicated for pt. There is no FH of ovarian cancer. There is a FH pancreatic cancer in her mat cousin and melanoma in her mom, but pt doesn't qualify for cancer genetic testing. The patient does do self-breast exams.  Colonoscopy: 2017  Repeat due after 5 years.   Tobacco use: The patient denies current or previous tobacco use. Alcohol use: social drinker  No drug use Exercise: moderately active  She does get adequate calcium and Vitamin D in her diet.  Labs with PCP.   Past Medical History:  Diagnosis Date  . ADHD (attention deficit hyperactivity disorder)   . Anemia   . Chicken pox   . Depression   . Essential tremor   . Hearing loss    follows with audologist  . IBS (irritable bowel syndrome)   . Insomnia   . Leucopenia    has blood draw every 5 years  . Measles   . Mitral valve prolapse   . Palpitations   . PVC (premature ventricular contraction)   . Urinary incontinence     Past Surgical History:  Procedure Laterality Date  . COLONOSCOPY WITH PROPOFOL N/A 01/04/2016   Procedure: COLONOSCOPY WITH PROPOFOL;  Surgeon: Manya Silvas, MD;  Location: [facility] ENDOSCOPY;  Service: Endoscopy;  Laterality: N/A;  . INGUINAL HERNIA REPAIR Right 1984   surgery performed to  remove endometrial mass and hernai was found secondary  . KNEE ARTHROSCOPY Right 1993    Family History  Problem Relation Age of Onset  . Hypertension Father   . Macular degeneration Father   . Heart disease Father   . Emphysema Father   . Melanoma Mother 59  . Anuerysm Mother        Abdominal  . Emphysema Mother   . Breast cancer Paternal Grandmother        38s  . Cervical cancer Maternal Grandmother   . Ovarian cancer Cousin     Social History   Socioeconomic History  . Marital status: Soil scientist    Spouse name: Not on file  . Number of children: Not on file  . Years of education: Not on file  . Highest education level: Not on file  Occupational History  . Not on file  Tobacco Use  . Smoking status: Never Smoker  . Smokeless tobacco: Never Used  Substance and Sexual Activity  . Alcohol use: Yes    Alcohol/week: 0.0 standard drinks    Comment: occ  . Drug use: No  . Sexual activity: Yes    Birth control/protection: Post-menopausal  Other Topics Concern  . Not on file  Social History Narrative   Single, has a partner.   Works as a Professor of Physical Therapy.  Works at [employer].   Enjoys photography, travel, being outdoors, relaxing.   Social Determinants of Health   Financial Resource Strain:   . Difficulty of Paying Living Expenses:   Food Insecurity:   . Worried About Charity fundraiser in the Last Year:   . Arboriculturist in the Last Year:   Transportation Needs:   . Film/video editor (Medical):   Marland Kitchen Lack of Transportation (Non-Medical):   Physical Activity:   . Days of Exercise per Week:   . Minutes of Exercise per Session:   Stress:   . Feeling of Stress :   Social Connections:   . Frequency of Communication with Friends and Family:   . Frequency of Social Gatherings with Friends and Family:   . Attends Religious Services:   . Active Member of Clubs or Organizations:   . Attends Archivist Meetings:   Marland Kitchen Marital Status:     Intimate Partner Violence:   . Fear of Current or Ex-Partner:   . Emotionally Abused:   Marland Kitchen Physically Abused:   . Sexually Abused:      Current Outpatient Medications:  .  buPROPion (WELLBUTRIN SR) 150 MG 12 hr tablet, Take 150 mg by mouth 2 (two) times daily. , Disp: , Rfl:  .  CALCIUM PO, Take 860 mg by mouth daily., Disp: , Rfl:  .  Cholecalciferol (VITAMIN D3) 2000 UNITS capsule, Take 2,000 Units by mouth every other day. , Disp: , Rfl:  .  DULoxetine (CYMBALTA) 60 MG capsule, Take 60 mg by mouth daily. , Disp: , Rfl: 0 .  Melatonin 3 MG TABS, Take 3 mg by mouth., Disp: , Rfl:  .  Omega-3 Fatty Acids (FISH OIL) 1000 MG CAPS, Take by mouth daily., Disp: , Rfl:  .  vitamin B-12 (CYANOCOBALAMIN) 100 MCG tablet, Take 100 mcg by mouth daily., Disp: , Rfl:  .  zaleplon (SONATA) 10 MG capsule, Take by mouth., Disp: , Rfl:      ROS:  Review of Systems  Constitutional: Negative for fatigue, fever and unexpected weight change.  Respiratory: Negative for cough, shortness of breath and wheezing.   Cardiovascular: Negative for chest pain, palpitations and leg swelling.  Gastrointestinal: Negative for blood in stool, constipation, diarrhea, nausea and vomiting.  Endocrine: Negative for cold intolerance, heat intolerance and polyuria.  Genitourinary: Negative for dyspareunia, dysuria, flank pain, frequency, genital sores, hematuria, menstrual problem, pelvic pain, urgency, vaginal bleeding, vaginal discharge and vaginal pain.  Musculoskeletal: Positive for arthralgias. Negative for back pain, joint swelling and myalgias.  Skin: Negative for rash.  Neurological: Negative for dizziness, syncope, light-headedness, numbness and headaches.  Hematological: Negative for adenopathy.  Psychiatric/Behavioral: Negative for agitation, confusion, sleep disturbance and suicidal ideas. The patient is not nervous/anxious.   BREAST: No symptoms    Objective: BP 120/90   Ht 5\' 9"  (1.753 m)   Wt 195 lb  (88.5 kg)   BMI 28.80 kg/m    Physical Exam Constitutional:      Appearance: She is well-developed.  Genitourinary:     Vulva, vagina, cervix, uterus, right adnexa and left adnexa normal.     No vulval lesion or tenderness noted.     No vaginal discharge, erythema or tenderness.     No cervical polyp.     Uterus is not enlarged or tender.     No right or left adnexal mass present.     Right adnexa not tender.     Left adnexa not  tender.  Neck:     Thyroid: No thyromegaly.  Cardiovascular:     Rate and Rhythm: Normal rate and regular rhythm.     Heart sounds: Normal heart sounds. No murmur.  Pulmonary:     Effort: Pulmonary effort is normal.     Breath sounds: Normal breath sounds.  Chest:     Breasts:        Right: No mass, nipple discharge, skin change or tenderness.        Left: No mass, nipple discharge, skin change or tenderness.  Abdominal:     Palpations: Abdomen is soft.     Tenderness: There is no abdominal tenderness. There is no guarding.  Musculoskeletal:        General: Normal range of motion.     Cervical back: Normal range of motion.  Neurological:     General: No focal deficit present.     Mental Status: She is alert and oriented to person, place, and time.     Cranial Nerves: No cranial nerve deficit.  Skin:    General: Skin is warm and dry.  Psychiatric:        Mood and Affect: Mood normal.        Behavior: Behavior normal.        Thought Content: Thought content normal.        Judgment: Judgment normal.  Vitals reviewed.    Assessment/Plan:  Encounter for annual routine gynecological examination  Cervical cancer screening - Plan: Cytology - PAP  Encounter for screening mammogram for malignant neoplasm of breast; pt current on mammo          GYN counsel breast self exam, mammography screening, menopause, adequate intake of calcium and vitamin D, diet and exercise    F/U  Return in about 2 years (around 12/19/2021).  Elhadji Pecore B. Nuchem Grattan,  PA-C 12/20/2019 2:19 PM

## 2019-12-20 ENCOUNTER — Ambulatory Visit (INDEPENDENT_AMBULATORY_CARE_PROVIDER_SITE_OTHER): Payer: Medicare Other | Admitting: Obstetrics and Gynecology

## 2019-12-20 ENCOUNTER — Encounter: Payer: Self-pay | Admitting: Obstetrics and Gynecology

## 2019-12-20 ENCOUNTER — Other Ambulatory Visit: Payer: Self-pay

## 2019-12-20 ENCOUNTER — Other Ambulatory Visit (HOSPITAL_COMMUNITY)
Admission: RE | Admit: 2019-12-20 | Discharge: 2019-12-20 | Disposition: A | Discharge status: Home / Self Care | Payer: Medicare Other | Source: Ambulatory Visit | Attending: Obstetrics and Gynecology | Admitting: Obstetrics and Gynecology

## 2019-12-20 VITALS — BP 120/90 | Ht 69.0 in | Wt 195.0 lb

## 2019-12-20 DIAGNOSIS — Z124 Encounter for screening for malignant neoplasm of cervix: Secondary | ICD-10-CM

## 2019-12-20 DIAGNOSIS — Z1231 Encounter for screening mammogram for malignant neoplasm of breast: Secondary | ICD-10-CM

## 2019-12-20 DIAGNOSIS — Z01419 Encounter for gynecological examination (general) (routine) without abnormal findings: Secondary | ICD-10-CM

## 2019-12-20 NOTE — Patient Instructions (Signed)
I value your feedback and entrusting us with your care. If you get a  patient survey, I would appreciate you taking the time to let us know about your experience today. Thank you!  As of July 08, 2019, your lab results will be released to your MyChart immediately, before I even have a chance to see them. Please give me time to review them and contact you if there are any abnormalities. Thank you for your patience.  

## 2019-12-22 LAB — CYTOLOGY - PAP: Diagnosis: NEGATIVE

## 2020-02-25 DIAGNOSIS — Z20822 Contact with and (suspected) exposure to covid-19: Secondary | ICD-10-CM | POA: Diagnosis not present

## 2020-08-02 DIAGNOSIS — Z20822 Contact with and (suspected) exposure to covid-19: Secondary | ICD-10-CM | POA: Diagnosis not present

## 2020-09-02 DIAGNOSIS — Z20822 Contact with and (suspected) exposure to covid-19: Secondary | ICD-10-CM | POA: Diagnosis not present

## 2021-11-30 IMAGING — CR DG CHEST 2V
1 series · 2 of 2 positions shown · non-contrast
Comparison: None.

CLINICAL DATA: Arrhythmia

EXAM:
CHEST - 2 VIEW

[Series 1: dg chest 2 view · 0.14mm/px · 2 of 2 slices shown]
[im 1/2]
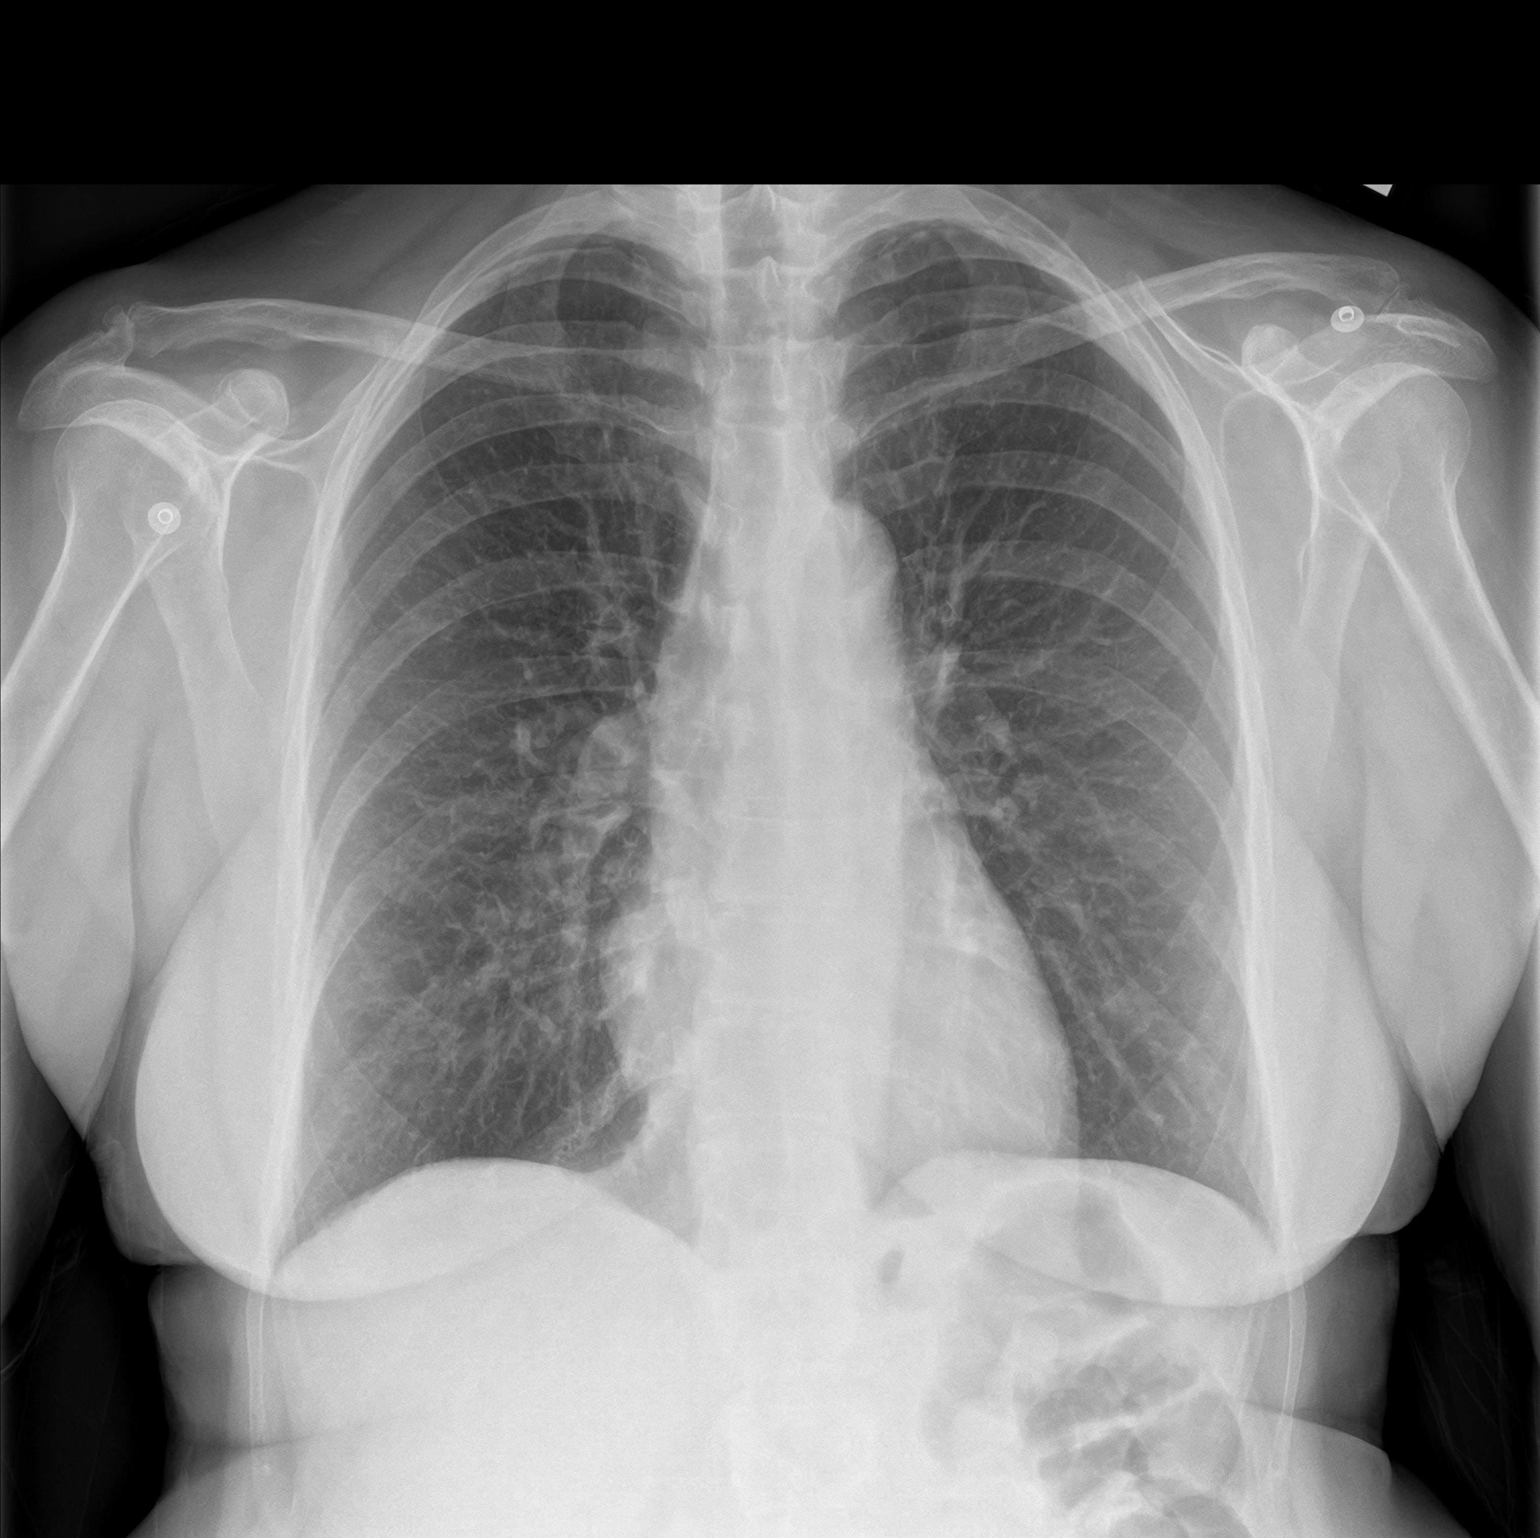
[im 2/2]
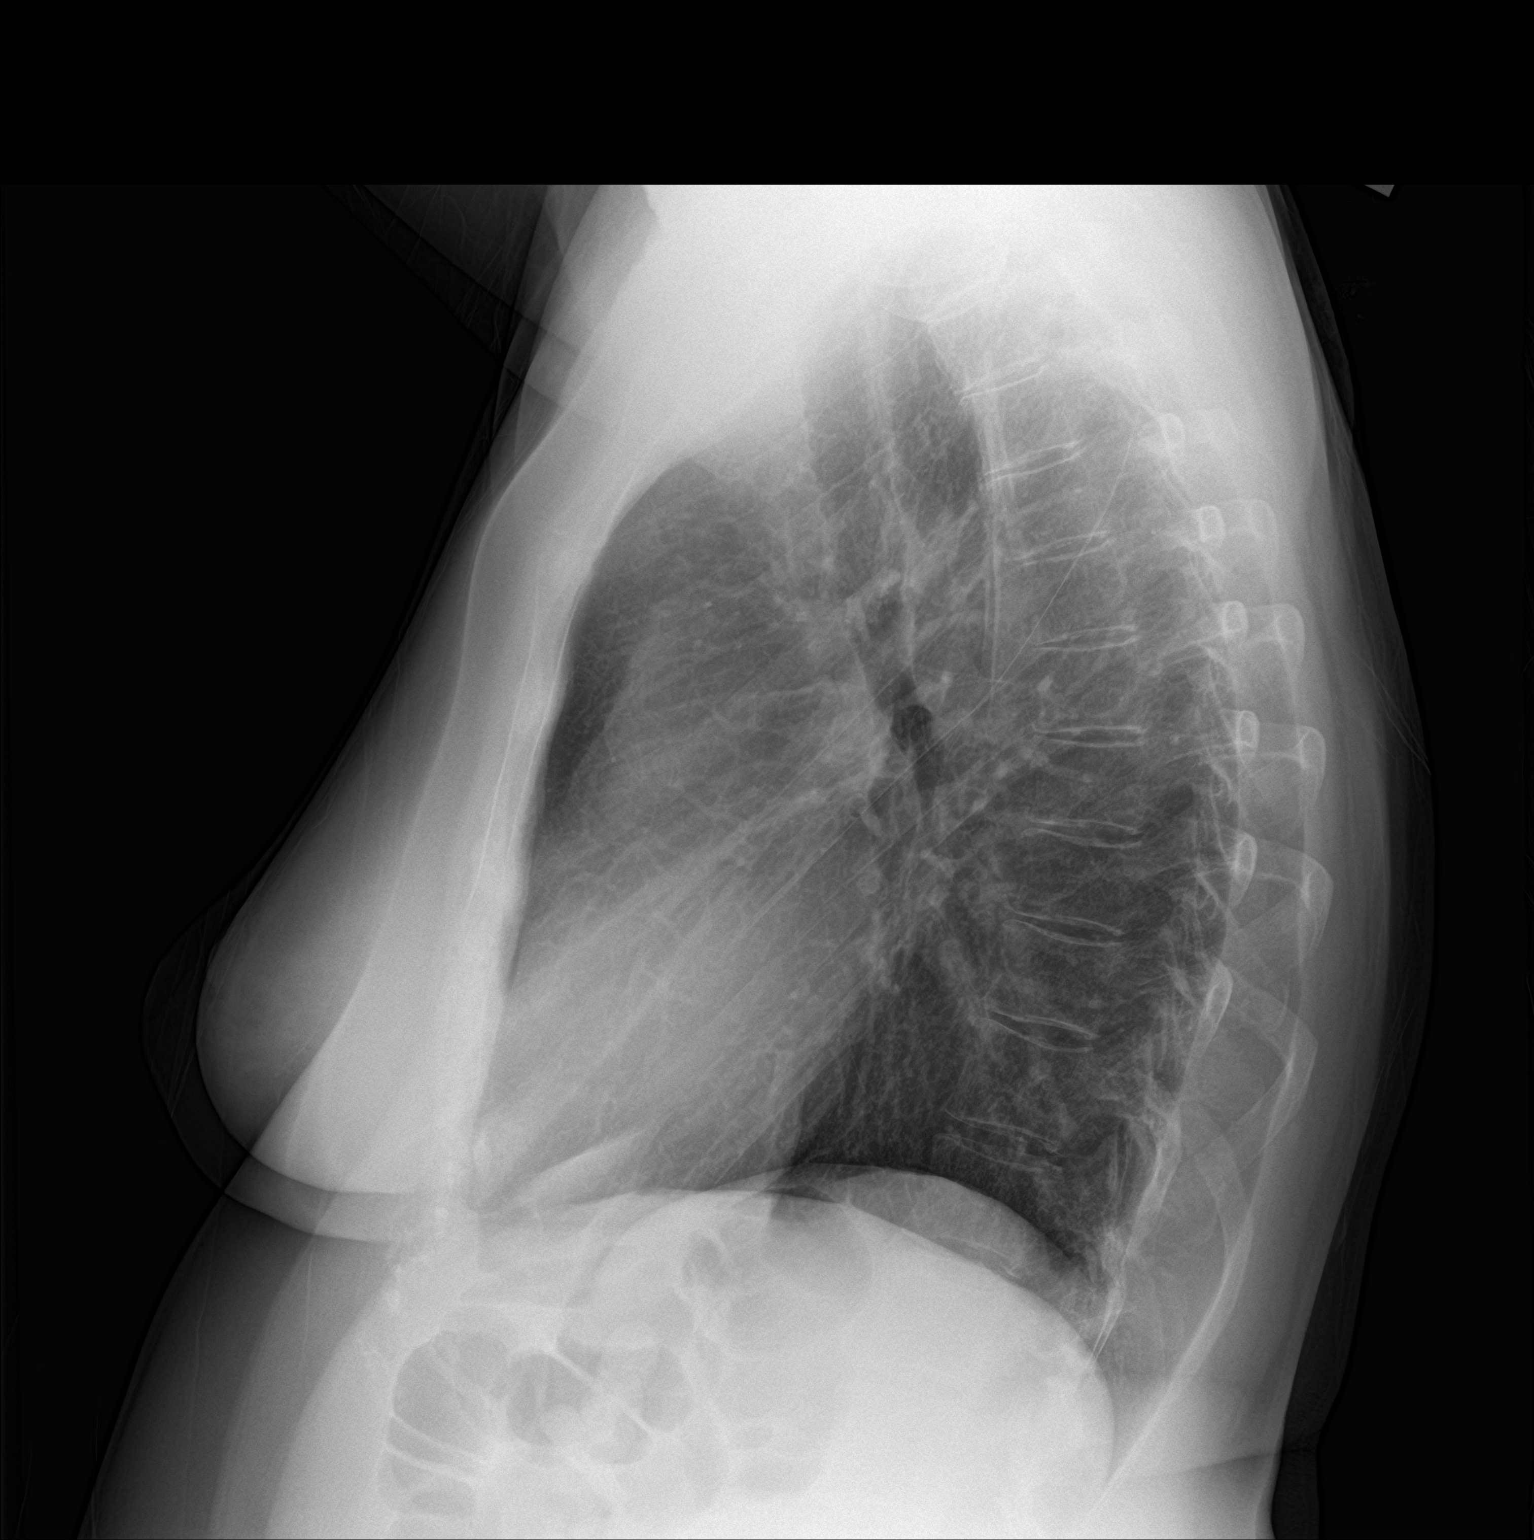

[2 of 2 positions shown; findings below may reference images not displayed]

FINDINGS: Heart and mediastinal contours are within normal limits. No focal
opacities or effusions. No acute bony abnormality.
IMPRESSION: No active cardiopulmonary disease.

## 2021-12-14 ENCOUNTER — Telehealth: Payer: Self-pay | Admitting: Cardiovascular Disease

## 2021-12-14 NOTE — Telephone Encounter (Signed)
3 attempts to schedule fu appt from recall list.   Deleting recall.   

## 2022-03-05 IMAGING — MG DIGITAL SCREENING BILAT W/ TOMO W/ CAD
8 series · 8 of 24 positions shown · non-contrast
Comparison: Previous exam(s).

CLINICAL DATA: Screening.

EXAM:
DIGITAL SCREENING BILATERAL MAMMOGRAM WITH TOMO AND CAD

[L MLO synth-2D]
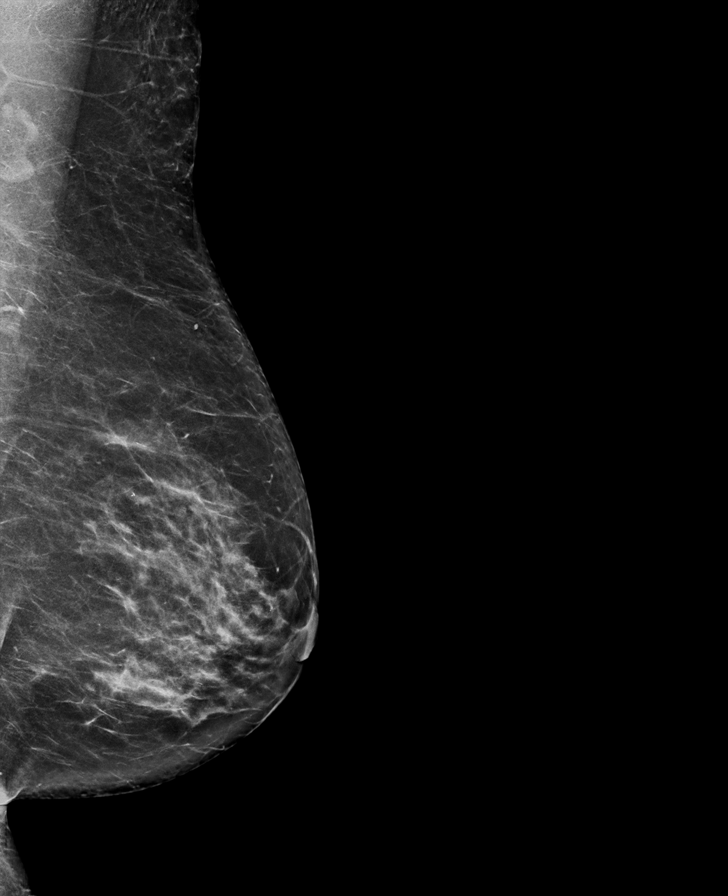

[R CC synth-2D]
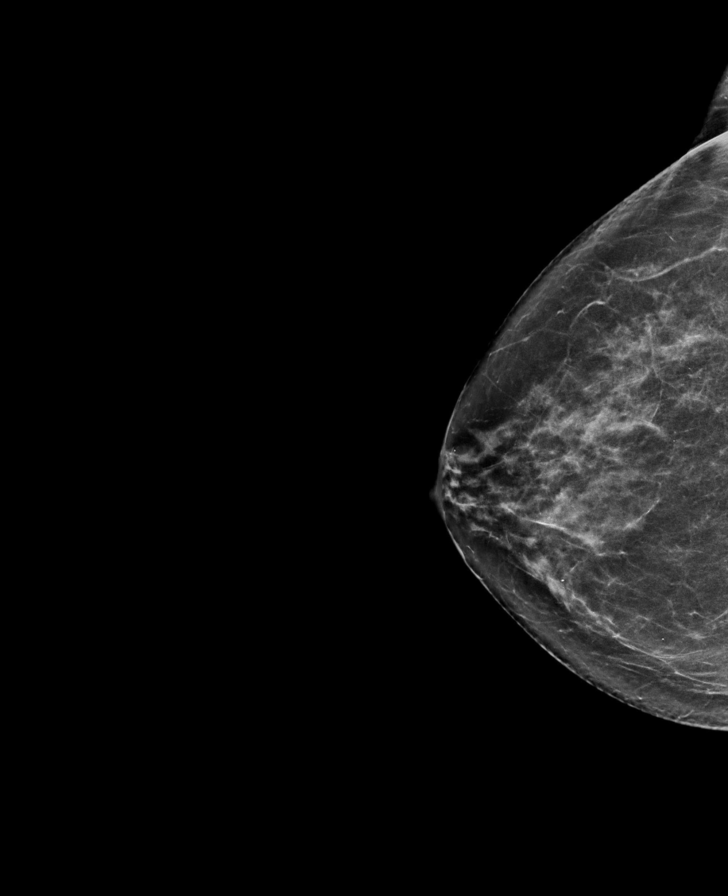

[R MLO synth-2D]
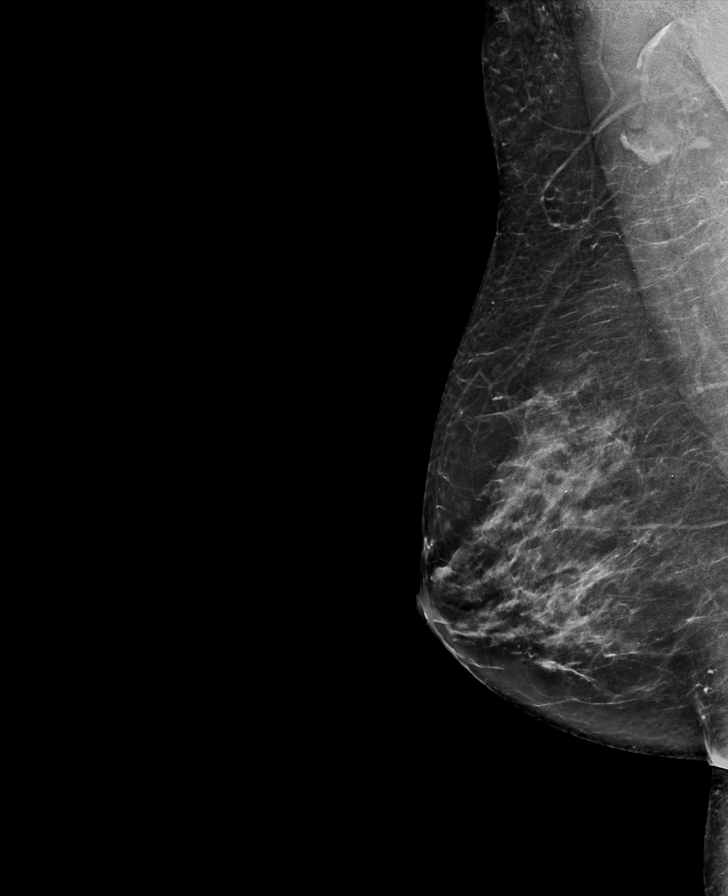

[L CC synth-2D]
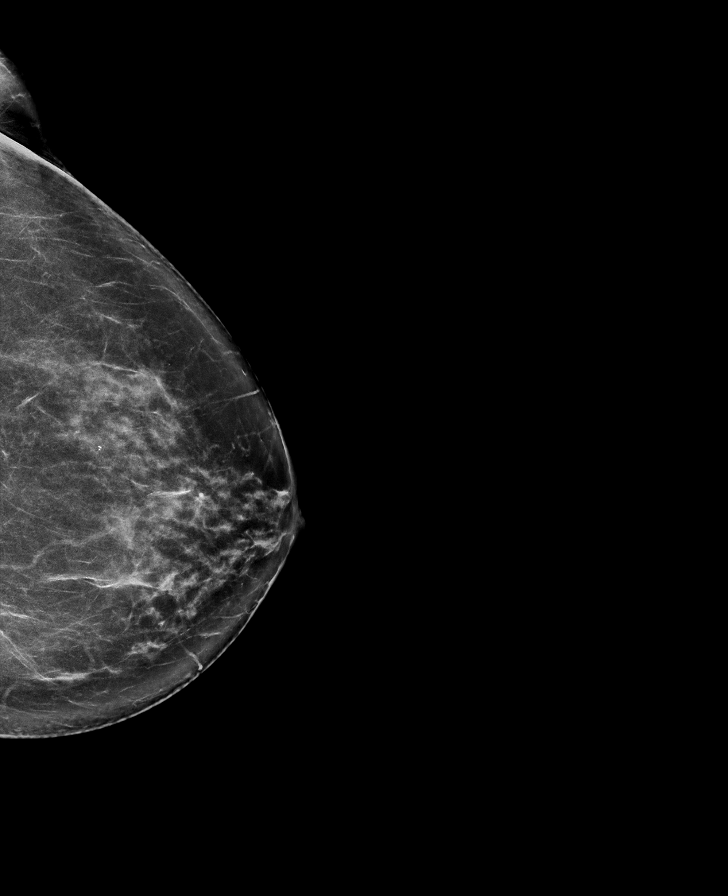

[L MLO tomo · tomo slice 37/72.0]
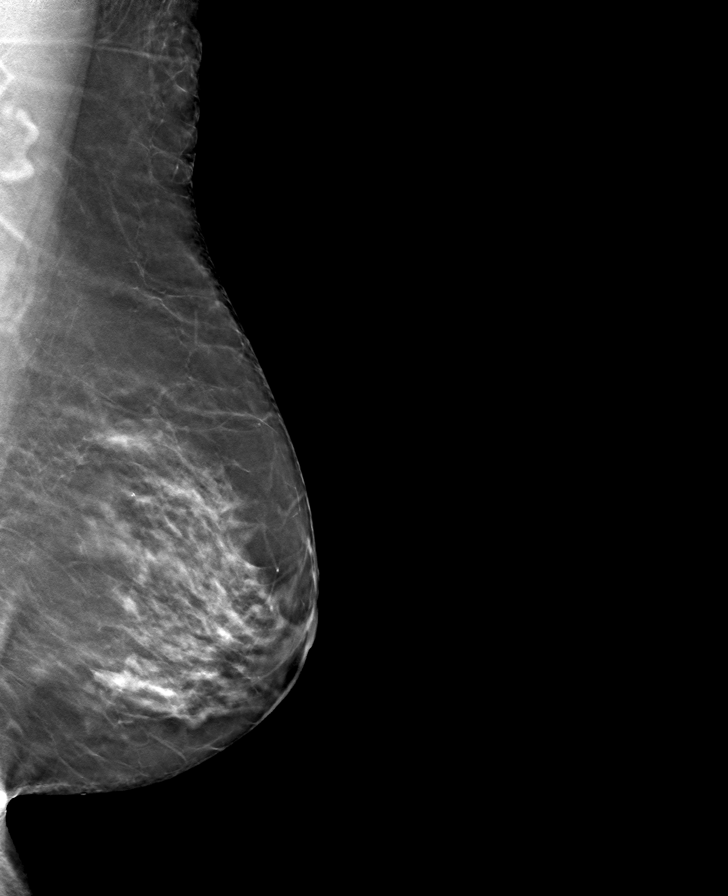

[R CC tomo · tomo slice 33/65.0]
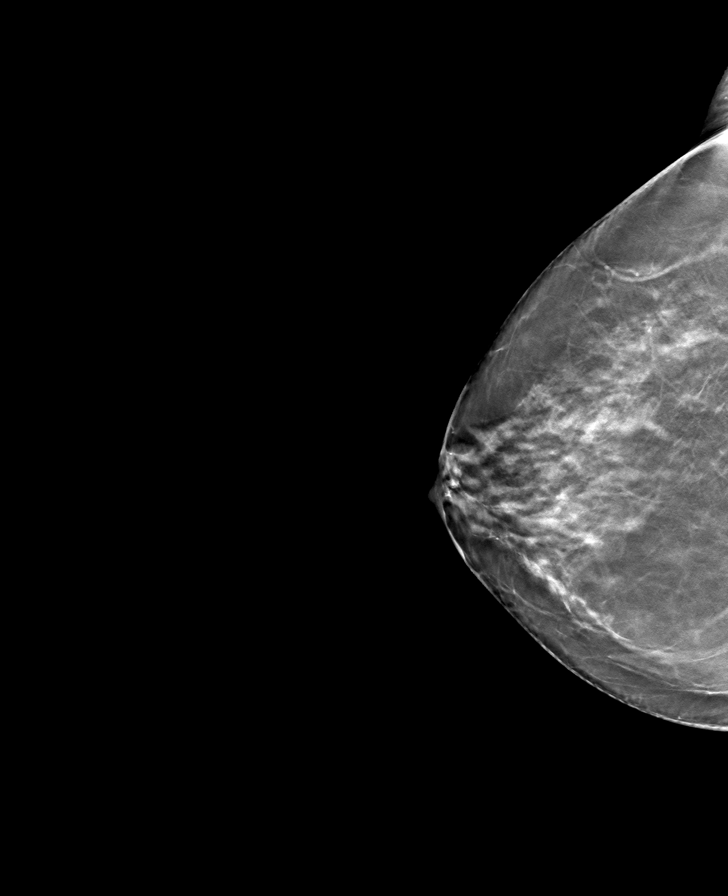

[L CC tomo · tomo slice 38/75.0]
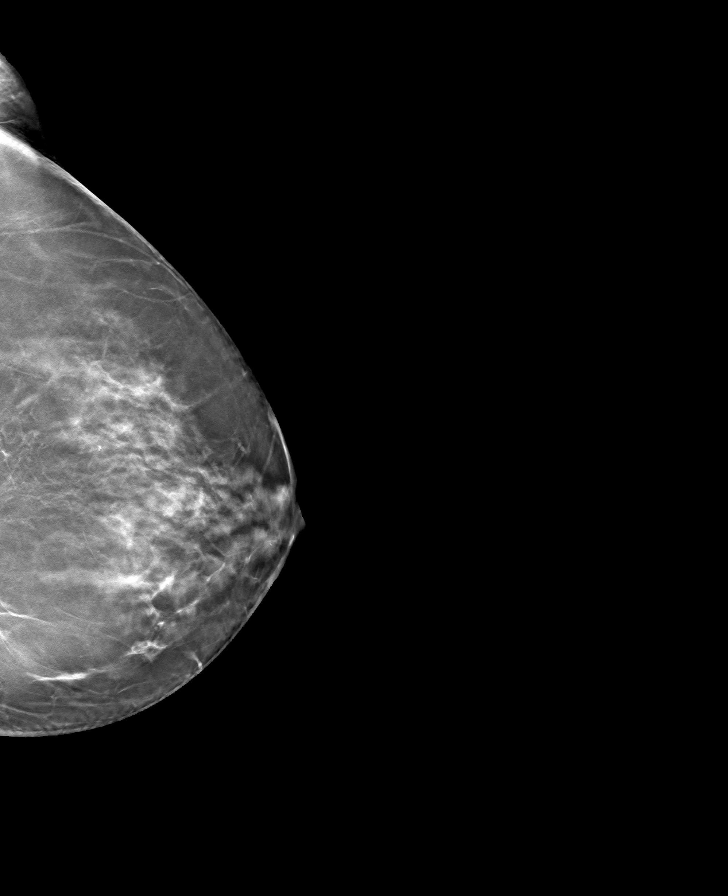

[R MLO tomo · tomo slice 36/71.0]
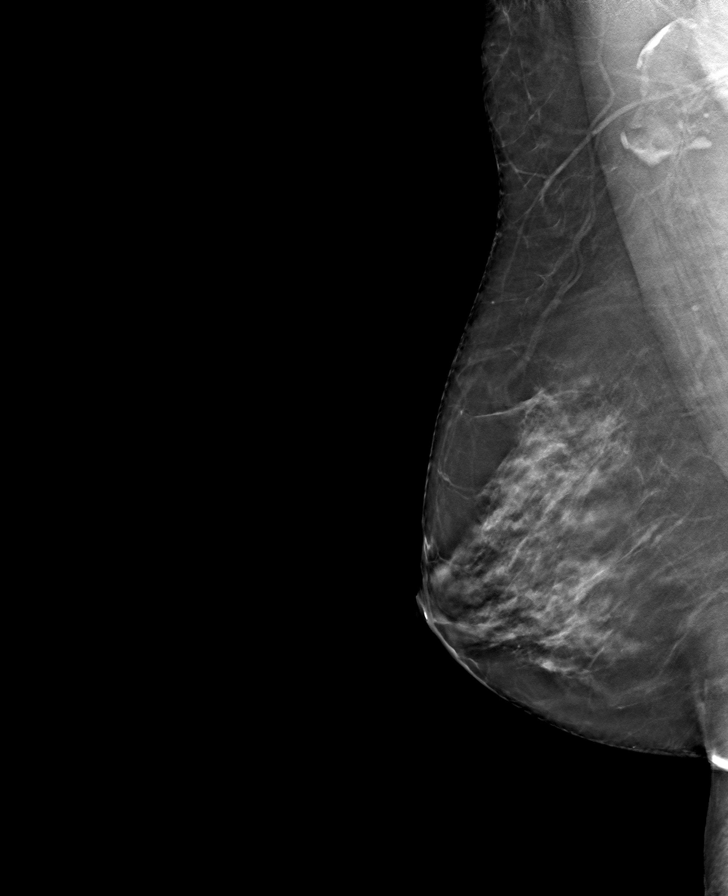

[8 of 24 positions shown; findings below may reference images not displayed]

ACR Breast Density Category c: The breast tissue is heterogeneously
dense, which may obscure small masses.
FINDINGS: There are no findings suspicious for malignancy. Images were
processed with CAD.
IMPRESSION: No mammographic evidence of malignancy. A result letter of this
screening mammogram will be mailed directly to the patient.

RECOMMENDATION:
Screening mammogram in one year. (Code:FT-U-LHB)

BI-RADS CATEGORY  1: Negative.

## 2022-03-14 ENCOUNTER — Encounter: Payer: Self-pay | Admitting: Primary Care

## 2022-03-14 ENCOUNTER — Ambulatory Visit (INDEPENDENT_AMBULATORY_CARE_PROVIDER_SITE_OTHER): Payer: Medicare Other | Admitting: Primary Care

## 2022-03-14 VITALS — BP 110/68 | HR 87 | Temp 98.6°F | Ht 69.0 in | Wt 190.0 lb

## 2022-03-14 DIAGNOSIS — Z1211 Encounter for screening for malignant neoplasm of colon: Secondary | ICD-10-CM

## 2022-03-14 DIAGNOSIS — E2839 Other primary ovarian failure: Secondary | ICD-10-CM

## 2022-03-14 DIAGNOSIS — Z0000 Encounter for general adult medical examination without abnormal findings: Secondary | ICD-10-CM | POA: Diagnosis not present

## 2022-03-14 DIAGNOSIS — Z114 Encounter for screening for human immunodeficiency virus [HIV]: Secondary | ICD-10-CM | POA: Diagnosis not present

## 2022-03-14 DIAGNOSIS — Z1159 Encounter for screening for other viral diseases: Secondary | ICD-10-CM | POA: Diagnosis not present

## 2022-03-14 DIAGNOSIS — F3342 Major depressive disorder, recurrent, in full remission: Secondary | ICD-10-CM

## 2022-03-14 DIAGNOSIS — Z23 Encounter for immunization: Secondary | ICD-10-CM

## 2022-03-14 DIAGNOSIS — G4700 Insomnia, unspecified: Secondary | ICD-10-CM

## 2022-03-14 DIAGNOSIS — Z1231 Encounter for screening mammogram for malignant neoplasm of breast: Secondary | ICD-10-CM | POA: Diagnosis not present

## 2022-03-14 LAB — LIPID PANEL
Cholesterol: 289 mg/dL — ABNORMAL HIGH (ref 0–200)
HDL: 81.2 mg/dL (ref >39.00)
LDL Cholesterol: 187 mg/dL — ABNORMAL HIGH (ref 0–99)
NonHDL: 207.57
Total CHOL/HDL Ratio: 4
Triglycerides: 101 mg/dL (ref 0.0–149.0)
VLDL: 20.2 mg/dL (ref 0.0–40.0)

## 2022-03-14 LAB — CBC
HCT: 42.6 % (ref 36.0–46.0)
Hemoglobin: 13.9 g/dL (ref 12.0–15.0)
MCHC: 32.6 g/dL (ref 30.0–36.0)
MCV: 88.4 fl (ref 78.0–100.0)
Platelets: 318 10*3/uL (ref 150.0–400.0)
RBC: 4.83 Mil/uL (ref 3.87–5.11)
RDW: 13.9 % (ref 11.5–15.5)
WBC: 3.1 10*3/uL — ABNORMAL LOW (ref 4.0–10.5)

## 2022-03-14 LAB — COMPREHENSIVE METABOLIC PANEL
ALT: 6 U/L (ref 0–35)
AST: 14 U/L (ref 0–37)
Albumin: 4.6 g/dL (ref 3.5–5.2)
Alkaline Phosphatase: 88 U/L (ref 39–117)
BUN: 8 mg/dL (ref 6–23)
CO2: 29 mEq/L (ref 19–32)
Calcium: 9.4 mg/dL (ref 8.4–10.5)
Chloride: 102 mEq/L (ref 96–112)
Creatinine, Ser: 0.76 mg/dL (ref 0.40–1.20)
GFR: 81.05 mL/min (ref >60.00)
Glucose, Bld: 92 mg/dL (ref 70–99)
Potassium: 4.9 mEq/L (ref 3.5–5.1)
Sodium: 138 mEq/L (ref 135–145)
Total Bilirubin: 0.5 mg/dL (ref 0.2–1.2)
Total Protein: 7.3 g/dL (ref 6.0–8.3)

## 2022-03-14 MED ORDER — DULOXETINE HCL 60 MG PO CPEP: 60.0000 mg | ORAL_CAPSULE | Freq: Every day | ORAL | 3 refills | Status: DC

## 2022-03-14 NOTE — Addendum Note (Signed)
Addended by: Francella Solian on: 03/14/2022 09:46 AM   Modules accepted: Orders

## 2022-03-14 NOTE — Progress Notes (Addendum)
Subjective:    Patient ID: Karen Prince, female    DOB: 1956-02-21, 66 y.o.   MRN: [id number]  Medication Refill Associated symptoms include arthralgias. Pertinent negatives include no chest pain, coughing, headaches or rash.    Karen Prince is a very pleasant 66 y.o. female who presents today for follow up of chronic conditions and for complete physical. She has been living in [place] for the last two years. Has not been seen by me since February 2021.  Immunizations: -Influenza: Does not complete  -Covid-19: 3 vaccines -Shingles: Completed Shingrix series -Pneumonia: Never completed  Diet: Fair diet.  Exercise: Regular exercise, walking and body resistance.  Eye exam: Completes annually  Dental exam: Completed several years.   Mammogram: Completed in 2021, due.   Colonoscopy: Completed in 2017, due 2022  Dexa: Completed years ago.    BP Readings from Last 3 Encounters:  03/14/22 110/68  12/20/19 120/90  09/20/19 140/84         Review of Systems  Constitutional:  Negative for unexpected weight change.  HENT:  Negative for rhinorrhea.   Respiratory:  Negative for cough and shortness of breath.   Cardiovascular:  Negative for chest pain.  Gastrointestinal:  Negative for constipation and diarrhea.  Genitourinary:  Negative for difficulty urinating.  Musculoskeletal:  Positive for arthralgias.  Skin:  Negative for rash.  Allergic/Immunologic: Negative for environmental allergies.  Neurological:  Negative for dizziness and headaches.  Psychiatric/Behavioral:  The patient is not nervous/anxious.          Past Medical History:  Diagnosis Date   ADHD (attention deficit hyperactivity disorder)    Anemia    Chicken pox    Depression    Essential tremor    Hearing loss    follows with audologist   IBS (irritable bowel syndrome)    Insomnia    Leucopenia    has blood draw every 5 years   Measles    Mitral valve prolapse    Palpitations    PVC  (premature ventricular contraction)    Urinary incontinence     Social History   Socioeconomic History   Marital status: Soil scientist    Spouse name: Not on file   Number of children: Not on file   Years of education: Not on file   Highest education level: Not on file  Occupational History   Not on file  Tobacco Use   Smoking status: Never   Smokeless tobacco: Never  Vaping Use   Vaping Use: Never used  Substance and Sexual Activity   Alcohol use: Yes    Alcohol/week: 0.0 standard drinks of alcohol    Comment: occ   Drug use: No   Sexual activity: Yes    Birth control/protection: Post-menopausal  Other Topics Concern   Not on file  Social History Narrative   Single, has a partner.   Works as a Professor of Physical Therapy.   Works at [employer].   Enjoys photography, travel, being outdoors, relaxing.   Social Determinants of Health   Financial Resource Strain: Not on file  Food Insecurity: Not on file  Transportation Needs: Not on file  Physical Activity: Not on file  Stress: Not on file  Social Connections: Not on file  Intimate Partner Violence: Not on file    Past Surgical History:  Procedure Laterality Date   COLONOSCOPY WITH PROPOFOL N/A 01/04/2016   Procedure: COLONOSCOPY WITH PROPOFOL;  Surgeon: Manya Silvas, MD;  Location: [facility] ENDOSCOPY;  Service: Endoscopy;  Laterality: N/A;   INGUINAL HERNIA REPAIR Right 1984   surgery performed to remove endometrial mass and hernai was found secondary   KNEE ARTHROSCOPY Right 1993    Family History  Problem Relation Age of Onset   Hypertension Father    Macular degeneration Father    Heart disease Father    Emphysema Father    Melanoma Mother 21   Anuerysm Mother        Abdominal   Emphysema Mother    Breast cancer Paternal Grandmother        61s   Cervical cancer Maternal Grandmother    Ovarian cancer Cousin     No Known Allergies  Current Outpatient Medications on File Prior to Visit  Medication  Sig Dispense Refill   buPROPion (WELLBUTRIN SR) 150 MG 12 hr tablet Take 150 mg by mouth daily.     CALCIUM PO Take 860 mg by mouth daily.     Cholecalciferol (VITAMIN D3) 2000 UNITS capsule Take 2,000 Units by mouth every other day.      Melatonin 3 MG TABS Take 3 mg by mouth.     Omega-3 Fatty Acids (FISH OIL) 1000 MG CAPS Take by mouth daily.     zaleplon (SONATA) 10 MG capsule Take by mouth.     vitamin B-12 (CYANOCOBALAMIN) 100 MCG tablet Take 100 mcg by mouth daily.     No current facility-administered medications on file prior to visit.    BP 110/68   Pulse 87   Temp 98.6 F (37 C) (Oral)   Ht '5\' 9"'$  (1.753 m)   Wt 190 lb (86.2 kg)   SpO2 98%   BMI 28.06 kg/m  Objective:   Physical Exam HENT:     Right Ear: Tympanic membrane and ear canal normal.     Left Ear: Tympanic membrane and ear canal normal.     Nose: Nose normal.  Eyes:     Conjunctiva/sclera: Conjunctivae normal.     Pupils: Pupils are equal, round, and reactive to light.  Neck:     Thyroid: No thyromegaly.  Cardiovascular:     Rate and Rhythm: Normal rate and regular rhythm.     Heart sounds: No murmur heard. Pulmonary:     Effort: Pulmonary effort is normal.     Breath sounds: Normal breath sounds. No rales.  Abdominal:     General: Bowel sounds are normal.     Palpations: Abdomen is soft.     Tenderness: There is no abdominal tenderness.  Musculoskeletal:        General: Normal range of motion.     Cervical back: Neck supple.  Lymphadenopathy:     Cervical: No cervical adenopathy.  Skin:    General: Skin is warm and dry.     Findings: No rash.  Neurological:     Mental Status: She is alert and oriented to person, place, and time.     Cranial Nerves: No cranial nerve deficit.     Deep Tendon Reflexes: Reflexes are normal and symmetric.  Psychiatric:        Mood and Affect: Mood normal.           Assessment & Plan:   Problem List Items Addressed This Visit       Other   Depression     Controlled.  Following with psychiatry. Continue bupropion SR 150 mg daily, duloxetine 60 mg daily      Relevant Medications   DULoxetine (CYMBALTA) 60 MG capsule  Insomnia    Controlled. Following with psychiatry. Continue Sonata 10 mg HS PRN      Preventative health care - Primary    Declines influenza vaccine. prevnar 20 provided during visit. Mammogram due, orders placed. colonoscopy due, referral placed to GI>  Discussed the importance of a healthy diet and regular exercise in order for weight loss, and to reduce the risk of further co-morbidity.  Exam stable. Labs pending.  Follow up in 1 year for repeat physical.       Relevant Orders   Lipid panel (Completed)   Comprehensive metabolic panel (Completed)   CBC (Completed)   Other Visit Diagnoses     Screening for HIV (human immunodeficiency virus)       Relevant Orders   HIV antibody (with reflex) (Completed)   Encounter for hepatitis C screening test for low risk patient       Relevant Orders   Hepatitis C Antibody (Completed)   Encounter for screening mammogram for malignant neoplasm of breast       Relevant Orders   MM 3D SCREEN BREAST BILATERAL   Estrogen deficiency       Relevant Orders   DG Bone Density   Screening for colon cancer       Relevant Orders   Ambulatory referral to Gastroenterology   Need for pneumococcal 20-valent conjugate vaccination       Relevant Orders   Pneumococcal conjugate vaccine 20-valent (Prevnar 20) (Completed)          Pleas Koch, NP

## 2022-03-14 NOTE — Patient Instructions (Addendum)
Stop by the lab prior to leaving today. I will notify you of your results once received.   Call the [facility] to schedule your mammogram and bone density scan.   You will be contacted regarding your referral to GI for the colonoscopy.  Please let us know if you have not been contacted within two weeks.   It was a pleasure to see you today!  _0   [facility]  [place] [address]  [phone]    Preventive Care 64 Years and Older, Female Preventive care refers to lifestyle choices and visits with your health care provider that can promote health and wellness. Preventive care visits are also called wellness exams. What can I expect for my preventive care visit? Counseling Your health care provider may ask you questions about your: Medical history, including: Past medical problems. Family medical history. Pregnancy and menstrual history. History of falls. Current health, including: Memory and ability to understand (cognition). Emotional well-being. Home life and relationship well-being. Sexual activity and sexual health. Lifestyle, including: Alcohol, nicotine or tobacco, and drug use. Access to firearms. Diet, exercise, and sleep habits. Work and work Statistician. Sunscreen use. Safety issues such as seatbelt and bike helmet use. Physical exam Your health care provider will check your: Height and weight. These may be used to calculate your BMI (body mass index). BMI is a measurement that tells if you are at a healthy weight. Waist circumference. This measures the distance around your waistline. This measurement also tells if you are at a healthy weight and may help predict your risk of certain diseases, such as type 2 diabetes and high blood pressure. Heart rate and blood pressure. Body temperature. Skin for abnormal spots. What immunizations do I need?  Vaccines are usually given at various  ages, according to a schedule. Your health care provider will recommend vaccines for you based on your age, medical history, and lifestyle or other factors, such as travel or where you work. What tests do I need? Screening Your health care provider may recommend screening tests for certain conditions. This may include: Lipid and cholesterol levels. Hepatitis C test. Hepatitis B test. HIV (human immunodeficiency virus) test. STI (sexually transmitted infection) testing, if you are at risk. Lung cancer screening. Colorectal cancer screening. Diabetes screening. This is done by checking your blood sugar (glucose) after you have not eaten for a while (fasting). Mammogram. Talk with your health care provider about how often you should have regular mammograms. BRCA-related cancer screening. This may be done if you have a family history of breast, ovarian, tubal, or peritoneal cancers. Bone density scan. This is done to screen for osteoporosis. Talk with your health care provider about your test results, treatment options, and if necessary, the need for more tests. Follow these instructions at home: Eating and drinking  Eat a diet that includes fresh fruits and vegetables, whole grains, lean protein, and low-fat dairy products. Limit your intake of foods with high amounts of sugar, saturated fats, and salt. Take vitamin and mineral supplements as recommended by your health care provider. Do not drink alcohol if your health care provider tells you not to drink. If you drink alcohol: Limit how much you have to 0-1 drink a day. Know how much alcohol is in your drink. In the [place] one drink equals one 12 oz bottle of beer (355 mL), one 5 oz glass of wine (148 mL), or one 1 oz glass  of hard liquor (44 mL). Lifestyle Brush your teeth every morning and night with fluoride toothpaste. Floss one time each day. Exercise for at least 30 minutes 5 or more days each week. Do not use any products that  contain nicotine or tobacco. These products include cigarettes, chewing tobacco, and vaping devices, such as e-cigarettes. If you need help quitting, ask your health care provider. Do not use drugs. If you are sexually active, practice safe sex. Use a condom or other form of protection in order to prevent STIs. Take aspirin only as told by your health care provider. Make sure that you understand how much to take and what form to take. Work with your health care provider to find out whether it is safe and beneficial for you to take aspirin daily. Ask your health care provider if you need to take a cholesterol-lowering medicine (statin). Find healthy ways to manage stress, such as: Meditation, yoga, or listening to music. Journaling. Talking to a trusted person. Spending time with friends and family. Minimize exposure to UV radiation to reduce your risk of skin cancer. Safety Always wear your seat belt while driving or riding in a vehicle. Do not drive: If you have been drinking alcohol. Do not ride with someone who has been drinking. When you are tired or distracted. While texting. If you have been using any mind-altering substances or drugs. Wear a helmet and other protective equipment during sports activities. If you have firearms in your house, make sure you follow all gun safety procedures. What's next? Visit your health care provider once a year for an annual wellness visit. Ask your health care provider how often you should have your eyes and teeth checked. Stay up to date on all vaccines. This information is not intended to replace advice given to you by your health care provider. Make sure you discuss any questions you have with your health care provider. Document Revised: 01/10/2021 Document Reviewed: 01/10/2021 Elsevier Patient Education  [place].

## 2022-03-15 ENCOUNTER — Other Ambulatory Visit: Payer: Self-pay

## 2022-03-15 ENCOUNTER — Telehealth: Payer: Self-pay

## 2022-03-15 DIAGNOSIS — Z86010 Personal history of colonic polyps: Secondary | ICD-10-CM

## 2022-03-15 LAB — HEPATITIS C ANTIBODY: Hepatitis C Ab: NONREACTIVE

## 2022-03-15 LAB — HIV ANTIBODY (ROUTINE TESTING W REFLEX): HIV 1&2 Ab, 4th Generation: NONREACTIVE

## 2022-03-15 MED ORDER — PEG 3350-KCL-NA BICARB-NACL 420 G PO SOLR: 4000.0000 mL | Freq: Once | ORAL | 0 refills | Status: AC

## 2022-03-15 NOTE — Telephone Encounter (Signed)
Gastroenterology Pre-Procedure Review  Request Date: 06/10/22 Requesting Physician: Dr. Marius Ditch  PATIENT REVIEW QUESTIONS: The patient responded to the following health history questions as indicated:    1. Are you having any GI issues? Constipation occasionally 2. Do you have a personal history of Polyps? yes (last colonoscopy performed by [facility] 01/04/16) 3. Do you have a family history of Colon Cancer or Polyps? no 4. Diabetes Mellitus? no 5. Joint replacements in the past 12 months?no 6. Major health problems in the past 3 months?no 7. Any artificial heart valves, MVP, or defibrillator?no    MEDICATIONS & ALLERGIES:    Patient reports the following regarding taking any anticoagulation/antiplatelet therapy:   Plavix, Coumadin, Eliquis, Xarelto, Lovenox, Pradaxa, Brilinta, or Effient? no Aspirin? no  Patient confirms/reports the following medications:  Current Outpatient Medications  Medication Sig Dispense Refill   buPROPion (WELLBUTRIN SR) 150 MG 12 hr tablet Take 150 mg by mouth daily.     CALCIUM PO Take 860 mg by mouth daily.     Cholecalciferol (VITAMIN D3) 2000 UNITS capsule Take 2,000 Units by mouth every other day.      DULoxetine (CYMBALTA) 60 MG capsule Take 1 capsule (60 mg total) by mouth daily. For depression and anxiety. 90 capsule 3   Melatonin 3 MG TABS Take 3 mg by mouth.     Omega-3 Fatty Acids (FISH OIL) 1000 MG CAPS Take by mouth daily.     vitamin B-12 (CYANOCOBALAMIN) 100 MCG tablet Take 100 mcg by mouth daily.     zaleplon (SONATA) 10 MG capsule Take by mouth.     No current facility-administered medications for this visit.    Patient confirms/reports the following allergies:  No Known Allergies  No orders of the defined types were placed in this encounter.   AUTHORIZATION INFORMATION Primary Insurance: 1D#: Group #:  Secondary Insurance: 1D#: Group #:  SCHEDULE INFORMATION: Date: 06/10/22 Time: Location: [facility]

## 2022-05-10 DIAGNOSIS — F3342 Major depressive disorder, recurrent, in full remission: Secondary | ICD-10-CM

## 2022-05-10 NOTE — Assessment & Plan Note (Addendum)
Controlled.  Following with psychiatry. Continue bupropion SR 150 mg daily, duloxetine 60 mg daily

## 2022-05-10 NOTE — Assessment & Plan Note (Signed)
Controlled. Following with psychiatry. Continue Sonata 10 mg HS PRN

## 2022-05-10 NOTE — Assessment & Plan Note (Signed)
Declines influenza vaccine. prevnar 20 provided during visit. Mammogram due, orders placed. colonoscopy due, referral placed to GI>  Discussed the importance of a healthy diet and regular exercise in order for weight loss, and to reduce the risk of further co-morbidity.  Exam stable. Labs pending.  Follow up in 1 year for repeat physical.

## 2022-05-22 ENCOUNTER — Ambulatory Visit (INDEPENDENT_AMBULATORY_CARE_PROVIDER_SITE_OTHER): Payer: Medicare Other | Admitting: *Deleted

## 2022-05-22 DIAGNOSIS — Z0000 Encounter for general adult medical examination without abnormal findings: Secondary | ICD-10-CM

## 2022-05-22 NOTE — Patient Instructions (Signed)
Karen Prince , Thank you for taking time to come for your Medicare Wellness Visit. I appreciate your ongoing commitment to your health goals. Please review the following plan we discussed and let me know if I can assist you in the future.   These are the goals we discussed:  Goals      Increase physical activity     Consist 30 minute walking        This is a list of the screening recommended for you and due dates:  Health Maintenance  Topic Date Due   DEXA scan (bone density measurement)  Never done   COVID-19 Vaccine (3 - Additional dose for Janssen series) 10/17/2021   Mammogram  12/09/2021   Flu Shot  10/27/2022*   Tetanus Vaccine  05/23/2023*   Medicare Annual Wellness Visit  06/22/2023   Colon Cancer Screening  01/03/2026   Pneumonia Vaccine  Completed   Hepatitis C Screening: USPSTF Recommendation to screen - Ages 18-79 yo.  Completed   Zoster (Shingles) Vaccine  Completed   HPV Vaccine  Aged Out  *Topic was postponed. The date shown is not the original due date.    Advanced directives: Education provided  Conditions/risks identified:     Preventive Care 42 Years and Older, Female Preventive care refers to lifestyle choices and visits with your health care provider that can promote health and wellness. What does preventive care include? A yearly physical exam. This is also called an annual well check. Dental exams once or twice a year. Routine eye exams. Ask your health care provider how often you should have your eyes checked. Personal lifestyle choices, including: Daily care of your teeth and gums. Regular physical activity. Eating a healthy diet. Avoiding tobacco and drug use. Limiting alcohol use. Practicing safe sex. Taking low-dose aspirin every day. Taking vitamin and mineral supplements as recommended by your health care provider. What happens during an annual well check? The services and screenings done by your health care provider during your annual  well check will depend on your age, overall health, lifestyle risk factors, and family history of disease. Counseling  Your health care provider may ask you questions about your: Alcohol use. Tobacco use. Drug use. Emotional well-being. Home and relationship well-being. Sexual activity. Eating habits. History of falls. Memory and ability to understand (cognition). Work and work Statistician. Reproductive health. Screening  You may have the following tests or measurements: Height, weight, and BMI. Blood pressure. Lipid and cholesterol levels. These may be checked every 5 years, or more frequently if you are over 63 years old. Skin check. Lung cancer screening. You may have this screening every year starting at age 33 if you have a 30-pack-year history of smoking and currently smoke or have quit within the past 15 years. Fecal occult blood test (FOBT) of the stool. You may have this test every year starting at age 55. Flexible sigmoidoscopy or colonoscopy. You may have a sigmoidoscopy every 5 years or a colonoscopy every 10 years starting at age 48. Hepatitis C blood test. Hepatitis B blood test. Sexually transmitted disease (STD) testing. Diabetes screening. This is done by checking your blood sugar (glucose) after you have not eaten for a while (fasting). You may have this done every 1-3 years. Bone density scan. This is done to screen for osteoporosis. You may have this done starting at age 48. Mammogram. This may be done every 1-2 years. Talk to your health care provider about how often you should have regular mammograms.  Talk with your health care provider about your test results, treatment options, and if necessary, the need for more tests. Vaccines  Your health care provider may recommend certain vaccines, such as: Influenza vaccine. This is recommended every year. Tetanus, diphtheria, and acellular pertussis (Tdap, Td) vaccine. You may need a Td booster every 10 years. Zoster  vaccine. You may need this after age 34. Pneumococcal 13-valent conjugate (PCV13) vaccine. One dose is recommended after age 69. Pneumococcal polysaccharide (PPSV23) vaccine. One dose is recommended after age 61. Talk to your health care provider about which screenings and vaccines you need and how often you need them. This information is not intended to replace advice given to you by your health care provider. Make sure you discuss any questions you have with your health care provider. Document Released: 08/11/2015 Document Revised: 04/03/2016 Document Reviewed: 05/16/2015 Elsevier Interactive Patient Education  2017 [place] Prevention in the Home Falls can cause injuries. They can happen to people of all ages. There are many things you can do to make your home safe and to help prevent falls. What can I do on the outside of my home? Regularly fix the edges of walkways and driveways and fix any cracks. Remove anything that might make you trip as you walk through a door, such as a raised step or threshold. Trim any bushes or trees on the path to your home. Use bright outdoor lighting. Clear any walking paths of anything that might make someone trip, such as rocks or tools. Regularly check to see if handrails are loose or broken. Make sure that both sides of any steps have handrails. Any raised decks and porches should have guardrails on the edges. Have any leaves, snow, or ice cleared regularly. Use sand or salt on walking paths during winter. Clean up any spills in your garage right away. This includes oil or grease spills. What can I do in the bathroom? Use night lights. Install grab bars by the toilet and in the tub and shower. Do not use towel bars as grab bars. Use non-skid mats or decals in the tub or shower. If you need to sit down in the shower, use a plastic, non-slip stool. Keep the floor dry. Clean up any water that spills on the floor as soon as it happens. Remove  soap buildup in the tub or shower regularly. Attach bath mats securely with double-sided non-slip rug tape. Do not have throw rugs and other things on the floor that can make you trip. What can I do in the bedroom? Use night lights. Make sure that you have a light by your bed that is easy to reach. Do not use any sheets or blankets that are too big for your bed. They should not hang down onto the floor. Have a firm chair that has side arms. You can use this for support while you get dressed. Do not have throw rugs and other things on the floor that can make you trip. What can I do in the kitchen? Clean up any spills right away. Avoid walking on wet floors. Keep items that you use a lot in easy-to-reach places. If you need to reach something above you, use a strong step stool that has a grab bar. Keep electrical cords out of the way. Do not use floor polish or wax that makes floors slippery. If you must use wax, use non-skid floor wax. Do not have throw rugs and other things on the floor that can make  you trip. What can I do with my stairs? Do not leave any items on the stairs. Make sure that there are handrails on both sides of the stairs and use them. Fix handrails that are broken or loose. Make sure that handrails are as long as the stairways. Check any carpeting to make sure that it is firmly attached to the stairs. Fix any carpet that is loose or worn. Avoid having throw rugs at the top or bottom of the stairs. If you do have throw rugs, attach them to the floor with carpet tape. Make sure that you have a light switch at the top of the stairs and the bottom of the stairs. If you do not have them, ask someone to add them for you. What else can I do to help prevent falls? Wear shoes that: Do not have high heels. Have rubber bottoms. Are comfortable and fit you well. Are closed at the toe. Do not wear sandals. If you use a stepladder: Make sure that it is fully opened. Do not climb a  closed stepladder. Make sure that both sides of the stepladder are locked into place. Ask someone to hold it for you, if possible. Clearly mark and make sure that you can see: Any grab bars or handrails. First and last steps. Where the edge of each step is. Use tools that help you move around (mobility aids) if they are needed. These include: Canes. Walkers. Scooters. Crutches. Turn on the lights when you go into a dark area. Replace any light bulbs as soon as they burn out. Set up your furniture so you have a clear path. Avoid moving your furniture around. If any of your floors are uneven, fix them. If there are any pets around you, be aware of where they are. Review your medicines with your doctor. Some medicines can make you feel dizzy. This can increase your chance of falling. Ask your doctor what other things that you can do to help prevent falls. This information is not intended to replace advice given to you by your health care provider. Make sure you discuss any questions you have with your health care provider. Document Released: 05/11/2009 Document Revised: 12/21/2015 Document Reviewed: 08/19/2014 Elsevier Interactive Patient Education  2017 [employer].

## 2022-05-22 NOTE — Progress Notes (Signed)
Subjective:   Karen Prince is a 66 y.o. female who presents for an Initial Medicare Annual Wellness Visit.  I connected with  Avacyn Kloosterman on 05/22/22 by a telephone enabled telemedicine application and verified that I am speaking with the correct person using two identifiers.   I discussed the limitations of evaluation and management by telemedicine. The patient expressed understanding and agreed to proceed.  Patient location: home  Provider location:  Tele-Health-home    Review of Systems     Cardiac Risk Factors include: advanced age (>56mn, >>33women)     Objective:    Today's Vitals   There is no height or weight on file to calculate BMI.     05/22/2022   12:06 PM 09/06/2019    9:13 PM 08/27/2016    9:15 AM  Advanced Directives  Does Patient Have a Medical Advance Directive? Yes Yes Yes  Type of [place] will Living will;Healthcare Power of Attorney   Does patient want to make changes to medical advance directive?  No - Guardian declined   [place]? No - copy requested No - copy requested     Current Medications (verified) Outpatient Encounter Medications as of 05/22/2022  Medication Sig   buPROPion (WELLBUTRIN SR) 150 MG 12 hr tablet Take 150 mg by mouth daily.   CALCIUM PO Take 860 mg by mouth daily.   Cholecalciferol (VITAMIN D3) 2000 UNITS capsule Take 2,000 Units by mouth every other day.    DULoxetine (CYMBALTA) 60 MG capsule Take 1 capsule (60 mg total) by mouth daily. For depression and anxiety.   Melatonin 3 MG TABS Take 3 mg by mouth.   Omega-3 Fatty Acids (FISH OIL) 1000 MG CAPS Take by mouth daily.   vitamin B-12 (CYANOCOBALAMIN) 100 MCG tablet Take 100 mcg by mouth daily.   zaleplon (SONATA) 10 MG capsule Take by mouth.   No facility-administered encounter medications on file as of 05/22/2022.    Allergies (verified) Patient has no known allergies.    History: Past Medical History:  Diagnosis Date   ADHD (attention deficit hyperactivity disorder)    Anemia    Chicken pox    Depression    Essential tremor    Hearing loss    follows with audologist   IBS (irritable bowel syndrome)    Insomnia    Leucopenia    has blood draw every 5 years   Measles    Mitral valve prolapse    Palpitations    PVC (premature ventricular contraction)    Urinary incontinence    Past Surgical History:  Procedure Laterality Date   COLONOSCOPY WITH PROPOFOL N/A 01/04/2016   Procedure: COLONOSCOPY WITH PROPOFOL;  Surgeon: RManya Silvas MD;  [place]  Service: Endoscopy;  Laterality: N/A;   INGUINAL HERNIA REPAIR Right 1984   surgery performed to remove endometrial mass and hernai was found secondary   KNEE ARTHROSCOPY Right 1993   Family History  Problem Relation Age of Onset   Hypertension Father    Macular degeneration Father    Heart disease Father    Emphysema Father    Melanoma Mother [phone]  Anuerysm Mother        Abdominal   Emphysema Mother    Breast cancer Paternal Grandmother        84s  Cervical cancer Maternal Grandmother    Ovarian cancer Cousin    Social History   Socioeconomic  History   Marital status: Soil scientist    Spouse name: Not on file   Number of children: Not on file   Years of education: Not on file   Highest education level: Not on file  Occupational History   Not on file  Tobacco Use   Smoking status: Never   Smokeless tobacco: Never  Vaping Use   Vaping Use: Never used  Substance and Sexual Activity   Alcohol use: Yes    Alcohol/week: 0.0 standard drinks of alcohol    Comment: occ   Drug use: No   Sexual activity: Yes    Birth control/protection: Post-menopausal  Other Topics Concern   Not on file  Social History Narrative   Single, has a partner.   Works as a Professor of Physical Therapy.   Works at [employer].   Enjoys photography, travel, being outdoors, relaxing.    Social Determinants of Health   Financial Resource Strain: Low Risk  (05/22/2022)   Overall Financial Resource Strain (CARDIA)    Difficulty of Paying Living Expenses: Not hard at all  Food Insecurity: No Food Insecurity (05/22/2022)   Hunger Vital Sign    Worried About Running Out of Food in the Last Year: Never true    Ran Out of Food in the Last Year: Never true  Transportation Needs: No Transportation Needs (05/22/2022)   PRAPARE - Hydrologist (Medical): No    Lack of Transportation (Non-Medical): No  Physical Activity: Insufficiently Active (05/22/2022)   Exercise Vital Sign    Days of Exercise per Week: 3 days    Minutes of Exercise per Session: 30 min  Stress: No Stress Concern Present (05/22/2022)   [place]    Feeling of Stress : Not at all  Social Connections: Moderately Isolated (05/22/2022)   Social Connection and Isolation Panel [NHANES]    Frequency of Communication with Friends and Family: More than three times a week    Frequency of Social Gatherings with Friends and Family: Once a week    Attends Religious Services: Never    Marine scientist or Organizations: No    Attends Music therapist: Never    Marital Status: Living with partner    Tobacco Counseling Counseling given: Not Answered   Clinical Intake:  Pre-visit preparation completed: Yes  Pain : No/denies pain     Diabetes: No  How often do you need to have someone help you when you read instructions, pamphlets, or other written materials from your doctor or pharmacy?: 1 - Never  Diabetic?  no  Interpreter Needed?: No  Information entered by :: Leroy Kennedy LPN   Activities of Daily Living    05/22/2022   12:11 PM  In your present state of health, do you have any difficulty performing the following activities:  Hearing? 1  Vision? 0  Difficulty concentrating or making  decisions? 0  Walking or climbing stairs? 1  Dressing or bathing? 0  Doing errands, shopping? 0  Preparing Food and eating ? N  Using the Toilet? N  In the past six months, have you accidently leaked urine? Y  Comment does not wear daily  Do you have problems with loss of bowel control? N  Managing your Finances? N  Housekeeping or managing your Housekeeping? N    Patient Care Team: Pleas Koch, NP as PCP - General (Nurse Practitioner)  Indicate any recent Medical Services you may  have received from other than [facility] providers in the past year (date may be approximate).     Assessment:   This is a routine wellness examination for Emanii.  Hearing/Vision screen Hearing Screening - Comments:: Bilateral hearing aids Vision Screening - Comments:: Up to date [facility]  Dietary issues and exercise activities discussed: Current Exercise Habits: Home exercise routine, Type of exercise: walking, Time (Minutes): 30, Frequency (Times/Week): 3, Weekly Exercise (Minutes/Week): 90, Intensity: Mild   Goals Addressed             This Visit's Progress    Increase physical activity       Consist 30 minute walking       Depression Screen    05/22/2022   12:13 PM 03/14/2022    9:03 AM 04/28/2015    1:25 PM  PHQ 2/9 Scores  PHQ - 2 Score 0 0 0  PHQ- 9 Score 5 0     Fall Risk    05/22/2022   12:06 PM  [place] in the past year? 1  Number falls in past yr: 0  Injury with Fall? 0  Follow up Education provided;Falls prevention discussed;Falls evaluation completed    FALL RISK PREVENTION PERTAINING TO THE HOME:  Any stairs in or around the home? Yes  If so, are there any without handrails? No  Home free of loose throw rugs in walkways, pet beds, electrical cords, etc? Yes  Adequate lighting in your home to reduce risk of falls? Yes   ASSISTIVE DEVICES UTILIZED TO PREVENT FALLS:  Life alert? No  Use of a cane, walker or w/c? No  Grab bars in the bathroom?  Yes  Shower chair or bench in shower? Yes  Elevated toilet seat or a handicapped toilet? Yes   TIMED UP AND GO:  Was the test performed? No .    Cognitive Function:        05/22/2022   12:09 PM  6CIT Screen  What Year? 0 points  What month? 0 points  What time? 0 points  Count back from 20 0 points  Months in reverse 0 points  Repeat phrase 0 points  Total Score 0 points    Immunizations Immunization History  Administered Date(s) Administered   DTP 11/27/2010   Hepatitis A 11/27/2010   Janssen (J&J) SARS-COV-2 Vaccination 10/28/2019   Moderna SARS-COV2 Booster Vaccination 07/10/2020   PNEUMOCOCCAL CONJUGATE-20 03/14/2022   Pfizer Covid-19 Vaccine Bivalent Booster 55yr & up 06/19/2021   Zoster Recombinat (Shingrix) 01/31/2017, 06/04/2017    TDAP status: Due, Education has been provided regarding the importance of this vaccine. Advised may receive this vaccine at local pharmacy or Health Dept. Aware to provide a copy of the vaccination record if obtained from local pharmacy or Health Dept. Verbalized acceptance and understanding.  Flu Vaccine status: Up to date  Pneumococcal vaccine status: Up to date  Covid-19 vaccine status: Information provided on how to obtain vaccines.   Qualifies for Shingles Vaccine? No   Zostavax completed No   Shingrix Completed?: Yes  Screening Tests Health Maintenance  Topic Date Due   DEXA SCAN  Never done   COVID-19 Vaccine (3 - Additional dose for Janssen series) 10/17/2021   MAMMOGRAM  12/09/2021   INFLUENZA VACCINE  10/27/2022 (Originally 02/26/2022)   TETANUS/TDAP  05/23/2023 (Originally 09/23/1974)   Medicare Annual Wellness (AWV)  06/22/2023   COLONOSCOPY (Pts 45-42yrInsurance coverage will need to be confirmed)  01/03/2026   Pneumonia Vaccine 6560Years old  Completed   Hepatitis C Screening  Completed   Zoster Vaccines- Shingrix  Completed   HPV VACCINES  Aged Out    Health Maintenance  Health Maintenance Due   Topic Date Due   DEXA SCAN  Never done   COVID-19 Vaccine (3 - Additional dose for Janssen series) 10/17/2021   MAMMOGRAM  12/09/2021    Colonoscopy  scheduled 06-10-2022  Mammogram Scheduled  Bone Density  Scheduled  Lung Cancer Screening: (Low Dose CT Chest recommended if Age 39-80 years, 30 pack-year currently smoking OR have quit w/in 15years.) does not qualify.   Lung Cancer Screening Referral:   Additional Screening:  Hepatitis C Screening: does not qualify; Completed 2023  Vision Screening: Recommended annual ophthalmology exams for early detection of glaucoma and other disorders of the eye. Is the patient up to date with their annual eye exam?  Yes  Who is the provider or what is the name of the office in which the patient attends annual eye exams? [facility] If pt is not established with a provider, would they like to be referred to a provider to establish care? No .   Dental Screening: Recommended annual dental exams for proper oral hygiene  Community Resource Referral / Chronic Care Management: CRR required this visit?  No   CCM required this visit?  No      Plan:     I have personally reviewed and noted the following in the patient's chart:   Medical and social history Use of alcohol, tobacco or illicit drugs  Current medications and supplements including opioid prescriptions. Patient is not currently taking opioid prescriptions. Functional ability and status Nutritional status Physical activity Advanced directives List of other physicians Hospitalizations, surgeries, and ER visits in previous 12 months Vitals Screenings to include cognitive, depression, and falls Referrals and appointments  In addition, I have reviewed and discussed with patient certain preventive protocols, quality metrics, and best practice recommendations. A written personalized care plan for preventive services as well as general preventive health recommendations were provided  to patient.     Leroy Kennedy, LPN   [id number]   Nurse Notes:

## 2022-06-04 MED ORDER — BUPROPION HCL ER (SR) 150 MG PO TB12: 150.0000 mg | ORAL_TABLET | Freq: Every day | ORAL | 2 refills | Status: DC

## 2022-06-10 ENCOUNTER — Ambulatory Visit: Payer: Medicare Other | Admitting: Registered Nurse

## 2022-06-10 ENCOUNTER — Ambulatory Visit
Admission: RE | Admit: 2022-06-10 | Discharge: 2022-06-10 | Disposition: A | Discharge status: Home / Self Care | Payer: Medicare Other | Attending: Gastroenterology | Admitting: Gastroenterology

## 2022-06-10 ENCOUNTER — Encounter
Admission: RE | Disposition: A | Discharge status: Home / Self Care | Payer: Self-pay | Source: Home / Self Care | Attending: Gastroenterology

## 2022-06-10 DIAGNOSIS — Z860100 Personal history of colon polyps, unspecified: Secondary | ICD-10-CM

## 2022-06-10 DIAGNOSIS — K635 Polyp of colon: Secondary | ICD-10-CM | POA: Diagnosis not present

## 2022-06-10 DIAGNOSIS — K5730 Diverticulosis of large intestine without perforation or abscess without bleeding: Secondary | ICD-10-CM | POA: Insufficient documentation

## 2022-06-10 DIAGNOSIS — K5790 Diverticulosis of intestine, part unspecified, without perforation or abscess without bleeding: Secondary | ICD-10-CM | POA: Diagnosis not present

## 2022-06-10 DIAGNOSIS — Z1211 Encounter for screening for malignant neoplasm of colon: Secondary | ICD-10-CM | POA: Insufficient documentation

## 2022-06-10 DIAGNOSIS — F32A Depression, unspecified: Secondary | ICD-10-CM | POA: Diagnosis not present

## 2022-06-10 HISTORY — PX: COLONOSCOPY WITH PROPOFOL: SHX5780

## 2022-06-10 SURGERY — COLONOSCOPY WITH PROPOFOL
Anesthesia: General

## 2022-06-10 MED ORDER — PROPOFOL 1000 MG/100ML IV EMUL
INTRAVENOUS | Status: AC
  Filled 2022-06-10: qty 200

## 2022-06-10 MED ORDER — SODIUM CHLORIDE 0.9 % IV SOLN
INTRAVENOUS | Status: DC
  Administered 2022-06-10: 20 mL/h via INTRAVENOUS

## 2022-06-10 MED ORDER — LIDOCAINE HCL (CARDIAC) PF 100 MG/5ML IV SOSY
PREFILLED_SYRINGE | INTRAVENOUS | Status: DC | PRN
  Administered 2022-06-10: 100 mg via INTRAVENOUS

## 2022-06-10 MED ORDER — PROPOFOL 10 MG/ML IV BOLUS
INTRAVENOUS | Status: DC | PRN
  Administered 2022-06-10: 90 mg via INTRAVENOUS

## 2022-06-10 MED ORDER — DEXMEDETOMIDINE HCL IN NACL 80 MCG/20ML IV SOLN
INTRAVENOUS | Status: AC
  Filled 2022-06-10: qty 20

## 2022-06-10 MED ORDER — PROPOFOL 10 MG/ML IV BOLUS
INTRAVENOUS | Status: AC
  Filled 2022-06-10: qty 20

## 2022-06-10 MED ORDER — PROPOFOL 500 MG/50ML IV EMUL
INTRAVENOUS | Status: DC | PRN
  Administered 2022-06-10: 162.651 ug/kg/min via INTRAVENOUS

## 2022-06-10 MED ORDER — GLYCOPYRROLATE 0.2 MG/ML IJ SOLN
INTRAMUSCULAR | Status: AC
  Filled 2022-06-10: qty 1

## 2022-06-10 MED ORDER — LIDOCAINE HCL (PF) 2 % IJ SOLN
INTRAMUSCULAR | Status: AC
  Filled 2022-06-10: qty 20

## 2022-06-10 NOTE — Anesthesia Preprocedure Evaluation (Signed)
Anesthesia Evaluation  Patient identified by MRN, date of birth, ID band Patient awake    Reviewed: Allergy & Precautions, H&P , NPO status , Patient's Chart, lab work & pertinent test results, reviewed documented beta blocker date and time   Airway Mallampati: II   Neck ROM: full    Dental  (+) Poor Dentition   Pulmonary neg pulmonary ROS   Pulmonary exam normal        Cardiovascular negative cardio ROS Normal cardiovascular exam Rhythm:regular Rate:Normal     Neuro/Psych  PSYCHIATRIC DISORDERS  Depression    negative neurological ROS     GI/Hepatic negative GI ROS, Neg liver ROS,,,  Endo/Other  negative endocrine ROS    Renal/GU negative Renal ROS  negative genitourinary   Musculoskeletal   Abdominal   Peds  Hematology  (+) Blood dyscrasia, anemia   Anesthesia Other Findings Past Medical History: No date: ADHD (attention deficit hyperactivity disorder) No date: Anemia No date: Chicken pox No date: Depression No date: Essential tremor No date: Hearing loss     Comment:  follows with audologist No date: IBS (irritable bowel syndrome) No date: Insomnia No date: Leucopenia     Comment:  has blood draw every 5 years No date: Measles No date: Mitral valve prolapse No date: Palpitations No date: PVC (premature ventricular contraction) No date: Urinary incontinence Past Surgical History: 01/04/2016: COLONOSCOPY WITH PROPOFOL; N/A     Comment:  Procedure: COLONOSCOPY WITH PROPOFOL;  Surgeon: Manya Silvas, MD;  Location: [facility];  Service:               Endoscopy;  Laterality: N/A; 1984: INGUINAL HERNIA REPAIR; Right     Comment:  surgery performed to remove endometrial mass and hernai               was found secondary 1993: KNEE ARTHROSCOPY; Right   Reproductive/Obstetrics negative OB ROS                             Anesthesia Physical Anesthesia  Plan  ASA: 2  Anesthesia Plan: General   Post-op Pain Management:    Induction:   PONV Risk Score and Plan:   Airway Management Planned:   Additional Equipment:   Intra-op Plan:   Post-operative Plan:   Informed Consent: I have reviewed the patients History and Physical, chart, labs and discussed the procedure including the risks, benefits and alternatives for the proposed anesthesia with the patient or authorized representative who has indicated his/her understanding and acceptance.     Dental Advisory Given  Plan Discussed with: CRNA  Anesthesia Plan Comments:        Anesthesia Quick Evaluation

## 2022-06-10 NOTE — Transfer of Care (Signed)
Immediate Anesthesia Transfer of Care Note  Patient: Karen Prince  Procedure(s) Performed: COLONOSCOPY WITH PROPOFOL  Patient Location: Endoscopy Unit  Anesthesia Type:General  Level of Consciousness: drowsy  Airway & Oxygen Therapy: Patient Spontanous Breathing  Post-op Assessment: Report given to RN and Post -op Vital signs reviewed and stable  Post vital signs: Reviewed and stable  Last Vitals:  Vitals Value Taken Time  BP 100/61   Temp    Pulse 76 06/10/22 0829  Resp 17 06/10/22 0829  SpO2 98 % 06/10/22 0829  Vitals shown include unvalidated device data.  Last Pain:  Vitals:   06/10/22 0720  TempSrc: Temporal  PainSc: 0-No pain         Complications: No notable events documented.

## 2022-06-10 NOTE — H&P (Signed)
Cephas Darby, MD [address]  [place]  [place], [place] [address]  Main: [phone]  Fax: [phone] Pager: [phone]  Primary Care Physician:  Pleas Koch, NP Primary Gastroenterologist:  Dr. Cephas Darby  Pre-Procedure History & Physical: HPI:  Karen Prince is a 66 y.o. female is here for an colonoscopy.   Past Medical History:  Diagnosis Date   ADHD (attention deficit hyperactivity disorder)    Anemia    Chicken pox    Depression    Essential tremor    Hearing loss    follows with audologist   IBS (irritable bowel syndrome)    Insomnia    Leucopenia    has blood draw every 5 years   Measles    Mitral valve prolapse    Palpitations    PVC (premature ventricular contraction)    Urinary incontinence     Past Surgical History:  Procedure Laterality Date   COLONOSCOPY WITH PROPOFOL N/A 01/04/2016   Procedure: COLONOSCOPY WITH PROPOFOL;  Surgeon: Manya Silvas, MD;  Location: [facility];  Service: Endoscopy;  Laterality: N/A;   INGUINAL HERNIA REPAIR Right 1984   surgery performed to remove endometrial mass and hernai was found secondary   KNEE ARTHROSCOPY Right 1993    Prior to Admission medications   Medication Sig Start Date End Date Taking? Authorizing Provider  buPROPion (WELLBUTRIN SR) 150 MG 12 hr tablet Take 1 tablet (150 mg total) by mouth daily. For depression 06/04/22  Yes Pleas Koch, NP  CALCIUM PO Take 860 mg by mouth daily.   Yes [provider]  Cholecalciferol (VITAMIN D3) 2000 UNITS capsule Take 2,000 Units by mouth every other day.    Yes [provider]  DULoxetine (CYMBALTA) 60 MG capsule Take 1 capsule (60 mg total) by mouth daily. For depression and anxiety. 03/14/22  Yes Pleas Koch, NP  Melatonin 3 MG TABS Take 3 mg by mouth.   Yes [provider]  Omega-3 Fatty Acids (FISH OIL) 1000 MG CAPS Take by mouth daily.   Yes [provider]  vitamin B-12  (CYANOCOBALAMIN) 100 MCG tablet Take 100 mcg by mouth daily.   Yes [provider]  zaleplon (SONATA) 10 MG capsule Take by mouth.   Yes [provider]    Allergies as of 03/15/2022   (No Known Allergies)    Family History  Problem Relation Age of Onset   Hypertension Father    Macular degeneration Father    Heart disease Father    Emphysema Father    Melanoma Mother 54   Anuerysm Mother        Abdominal   Emphysema Mother    Breast cancer Paternal Grandmother        67s   Cervical cancer Maternal Grandmother    Ovarian cancer Cousin     Social History   Socioeconomic History   Marital status: Soil scientist    Spouse name: Not on file   Number of children: Not on file   Years of education: Not on file   Highest education level: Not on file  Occupational History   Not on file  Tobacco Use   Smoking status: Never   Smokeless tobacco: Never  Vaping Use   Vaping Use: Never used  Substance and Sexual Activity   Alcohol use: Yes    Alcohol/week: 0.0 standard drinks of alcohol    Comment: occ   Drug use: No   Sexual activity: Yes  Birth control/protection: Post-menopausal  Other Topics Concern   Not on file  Social History Narrative   Single, has a partner.   Works as a Professor of Physical Therapy.   Works at [employer].   Enjoys photography, travel, being outdoors, relaxing.   Social Determinants of Health   Financial Resource Strain: Low Risk  (05/22/2022)   Overall Financial Resource Strain (CARDIA)    Difficulty of Paying Living Expenses: Not hard at all  Food Insecurity: No Food Insecurity (05/22/2022)   Hunger Vital Sign    Worried About Running Out of Food in the Last Year: Never true    Ran Out of Food in the Last Year: Never true  Transportation Needs: No Transportation Needs (05/22/2022)   PRAPARE - Hydrologist (Medical): No    Lack of Transportation (Non-Medical): No  Physical Activity:  Insufficiently Active (05/22/2022)   Exercise Vital Sign    Days of Exercise per Week: 3 days    Minutes of Exercise per Session: 30 min  Stress: No Stress Concern Present (05/22/2022)   [place]    Feeling of Stress : Not at all  Social Connections: Moderately Isolated (05/22/2022)   Social Connection and Isolation Panel [NHANES]    Frequency of Communication with Friends and Family: More than three times a week    Frequency of Social Gatherings with Friends and Family: Once a week    Attends Religious Services: Never    Marine scientist or Organizations: No    Attends Archivist Meetings: Never    Marital Status: Living with partner  Intimate Partner Violence: Not At Risk (05/22/2022)   Humiliation, Afraid, Rape, and Kick questionnaire    Fear of Current or Ex-Partner: No    Emotionally Abused: No    Physically Abused: No    Sexually Abused: No    Review of Systems: See HPI, otherwise negative ROS  Physical Exam: BP 128/75   Pulse 81   Temp (!) 96.6 F (35.9 C) (Temporal)   Resp 20   Ht '5\' 9"'$  (1.753 m)   Wt 83 kg   SpO2 99%   BMI 27.02 kg/m  General:   Alert,  pleasant and cooperative in NAD Head:  Normocephalic and atraumatic. Neck:  Supple; no masses or thyromegaly. Lungs:  Clear throughout to auscultation.    Heart:  Regular rate and rhythm. Abdomen:  Soft, nontender and nondistended. Normal bowel sounds, without guarding, and without rebound.   Neurologic:  Alert and  oriented x4;  grossly normal neurologically.  Impression/Plan: Karen Prince is here for an colonoscopy to be performed for h/o colon adenomas  Risks, benefits, limitations, and alternatives regarding  colonoscopy have been reviewed with the patient.  Questions have been answered.  All parties agreeable.   Sherri Sear, MD  06/10/2022, 7:48 AM

## 2022-06-10 NOTE — Op Note (Signed)
[facility] Patient Name: Karen Prince Procedure Date: 06/10/2022 7:54 AM MRN: [id number] Account #: [id number] Date of Birth: 13-Oct-1955 Admit Type: Outpatient Age: 66 Room: [facility] ENDO ROOM 4 Gender: Female Note Status: Finalized Instrument Name: Jasper Riling [id number] Procedure:             Colonoscopy Indications:           Surveillance: Personal history of adenomatous polyps                         on last colonoscopy > 5 years ago, Last colonoscopy:                         June 2017 Providers:             Lin Landsman MD, MD Referring MD:          Pleas Koch (Referring MD) Medicines:             General Anesthesia Complications:         No immediate complications. Estimated blood loss: None. Procedure:             Pre-Anesthesia Assessment:                        - Prior to the procedure, a History and Physical was                         performed, and patient medications and allergies were                         reviewed. The patient is competent. The risks and                         benefits of the procedure and the sedation options and                         risks were discussed with the patient. All questions                         were answered and informed consent was obtained.                         Patient identification and proposed procedure were                         verified by the physician, the nurse, the                         anesthesiologist, the anesthetist and the technician                         in the pre-procedure area in the procedure room in the                         endoscopy suite. Mental Status Examination: alert and                         oriented. Airway Examination: normal oropharyngeal  airway and neck mobility. Respiratory Examination:                         clear to auscultation. CV Examination: normal.                         Prophylactic Antibiotics: The  patient does not require                         prophylactic antibiotics. Prior Anticoagulants: The                         patient has taken no anticoagulant or antiplatelet                         agents. ASA Grade Assessment: II - A patient with mild                         systemic disease. After reviewing the risks and                         benefits, the patient was deemed in satisfactory                         condition to undergo the procedure. The anesthesia                         plan was to use general anesthesia. Immediately prior                         to administration of medications, the patient was                         re-assessed for adequacy to receive sedatives. The                         heart rate, respiratory rate, oxygen saturations,                         blood pressure, adequacy of pulmonary ventilation, and                         response to care were monitored throughout the                         procedure. The physical status of the patient was                         re-assessed after the procedure.                        After obtaining informed consent, the colonoscope was                         passed under direct vision. Throughout the procedure,                         the patient's blood pressure, pulse, and oxygen  saturations were monitored continuously. The                         Colonoscope was introduced through the anus and                         advanced to the the cecum, identified by appendiceal                         orifice and ileocecal valve. The colonoscopy was                         performed without difficulty. The patient tolerated                         the procedure well. The quality of the bowel                         preparation was excellent. The ileocecal valve,                         appendiceal orifice, and rectum were photographed. Findings:      The perianal and digital rectal  examinations were normal. Pertinent       negatives include normal sphincter tone and no palpable rectal lesions.      A diminutive polyp was found in the descending colon. The polyp was       sessile. The polyp was removed with a cold biopsy forceps. Resection and       retrieval were complete. Estimated blood loss: none.      A few small-mouthed diverticula were found in the recto-sigmoid colon.      The retroflexed view of the distal rectum and anal verge was normal and       showed no anal or rectal abnormalities. Impression:            - One diminutive polyp in the descending colon,                         removed with a cold biopsy forceps. Resected and                         retrieved.                        - Diverticulosis in the recto-sigmoid colon.                        - The distal rectum and anal verge are normal on                         retroflexion view. Recommendation:        - Discharge patient to home (with escort).                        - Resume previous diet today.                        - Continue present medications.                        -  Await pathology results.                        - Repeat colonoscopy in 7-10 years for surveillance                         based on pathology results. Procedure Code(s):     --- Professional ---                        [phone], Colonoscopy, flexible; with biopsy, single or                         multiple Diagnosis Code(s):     --- Professional ---                        Z86.010, Personal history of colonic polyps                        D12.4, Benign neoplasm of descending colon                        K57.30, Diverticulosis of large intestine without                         perforation or abscess without bleeding CPT copyright 2022 American Medical Association. All rights reserved. The codes documented in this report are preliminary and upon coder review may  be revised to meet current compliance requirements. Dr. Ulyess Mort Lin Landsman MD, MD 06/10/2022 8:28:15 AM This report has been signed electronically. Number of Addenda: 0 Note Initiated On: 06/10/2022 7:54 AM Scope Withdrawal Time: 0 hours 15 minutes 9 seconds  Total Procedure Duration: 0 hours 23 minutes 51 seconds  Estimated Blood Loss:  Estimated blood loss: none.      [facility]

## 2022-06-10 NOTE — Anesthesia Postprocedure Evaluation (Signed)
Anesthesia Post Note  Patient: Karen Prince  Procedure(s) Performed: COLONOSCOPY WITH PROPOFOL  Patient location during evaluation: PACU Anesthesia Type: General Level of consciousness: awake and alert Pain management: pain level controlled Vital Signs Assessment: post-procedure vital signs reviewed and stable Respiratory status: spontaneous breathing, nonlabored ventilation, respiratory function stable and patient connected to nasal cannula oxygen Cardiovascular status: blood pressure returned to baseline and stable Postop Assessment: no apparent nausea or vomiting Anesthetic complications: no   No notable events documented.   Last Vitals:  Vitals:   06/10/22 0720 06/10/22 0830  BP: 128/75 (!) 100/51  Pulse: 81 76  Resp: 20 16  Temp: (!) 35.9 C (!) 36 C  SpO2: 99% 99%    Last Pain:  Vitals:   06/10/22 0830  TempSrc: Tympanic  PainSc: [place] Latrisha Coiro

## 2022-06-11 ENCOUNTER — Encounter: Payer: Self-pay | Admitting: Gastroenterology

## 2022-06-12 ENCOUNTER — Encounter: Payer: Self-pay | Admitting: Gastroenterology

## 2022-06-12 LAB — SURGICAL PATHOLOGY

## 2022-07-18 ENCOUNTER — Ambulatory Visit
Admission: RE | Admit: 2022-07-18 | Discharge: 2022-07-18 | Disposition: A | Discharge status: Home / Self Care | Payer: Medicare Other | Source: Ambulatory Visit | Attending: Primary Care | Admitting: Primary Care

## 2022-07-18 DIAGNOSIS — E2839 Other primary ovarian failure: Secondary | ICD-10-CM | POA: Insufficient documentation

## 2022-07-18 DIAGNOSIS — M8589 Other specified disorders of bone density and structure, multiple sites: Secondary | ICD-10-CM | POA: Diagnosis not present

## 2022-07-18 DIAGNOSIS — Z1231 Encounter for screening mammogram for malignant neoplasm of breast: Secondary | ICD-10-CM | POA: Diagnosis not present

## 2022-08-05 DIAGNOSIS — E785 Hyperlipidemia, unspecified: Secondary | ICD-10-CM

## 2022-08-06 MED ORDER — ATORVASTATIN CALCIUM 20 MG PO TABS: 20.0000 mg | ORAL_TABLET | Freq: Every day | ORAL | 0 refills | Status: DC

## 2022-11-01 ENCOUNTER — Other Ambulatory Visit (INDEPENDENT_AMBULATORY_CARE_PROVIDER_SITE_OTHER): Payer: Medicare Other

## 2022-11-01 ENCOUNTER — Other Ambulatory Visit: Payer: Self-pay | Admitting: Primary Care

## 2022-11-01 DIAGNOSIS — E785 Hyperlipidemia, unspecified: Secondary | ICD-10-CM

## 2022-11-01 LAB — HEPATIC FUNCTION PANEL
ALT: 7 U/L (ref 0–35)
AST: 15 U/L (ref 0–37)
Albumin: 4.5 g/dL (ref 3.5–5.2)
Alkaline Phosphatase: 81 U/L (ref 39–117)
Bilirubin, Direct: 0.1 mg/dL (ref 0.0–0.3)
Total Bilirubin: 0.5 mg/dL (ref 0.2–1.2)
Total Protein: 7 g/dL (ref 6.0–8.3)

## 2022-11-01 LAB — LIPID PANEL
Cholesterol: 171 mg/dL (ref 0–200)
HDL: 81.4 mg/dL (ref >39.00)
LDL Cholesterol: 76 mg/dL (ref 0–99)
NonHDL: 89.63
Total CHOL/HDL Ratio: 2
Triglycerides: 66 mg/dL (ref 0.0–149.0)
VLDL: 13.2 mg/dL (ref 0.0–40.0)

## 2022-12-11 DIAGNOSIS — H5203 Hypermetropia, bilateral: Secondary | ICD-10-CM | POA: Diagnosis not present

## 2022-12-11 DIAGNOSIS — H269 Unspecified cataract: Secondary | ICD-10-CM | POA: Diagnosis not present

## 2022-12-11 DIAGNOSIS — H52223 Regular astigmatism, bilateral: Secondary | ICD-10-CM | POA: Diagnosis not present

## 2022-12-11 DIAGNOSIS — H04123 Dry eye syndrome of bilateral lacrimal glands: Secondary | ICD-10-CM | POA: Diagnosis not present

## 2022-12-11 DIAGNOSIS — H524 Presbyopia: Secondary | ICD-10-CM | POA: Diagnosis not present

## 2022-12-22 ENCOUNTER — Emergency Department: Payer: Medicare Other

## 2022-12-22 ENCOUNTER — Other Ambulatory Visit: Payer: Self-pay

## 2022-12-22 ENCOUNTER — Emergency Department
Admission: EM | Admit: 2022-12-22 | Discharge: 2022-12-22 | Disposition: A | Discharge status: Home / Self Care | Payer: Medicare Other | Attending: Orthopedic Surgery | Admitting: Orthopedic Surgery

## 2022-12-22 DIAGNOSIS — W010XXA Fall on same level from slipping, tripping and stumbling without subsequent striking against object, initial encounter: Secondary | ICD-10-CM | POA: Diagnosis not present

## 2022-12-22 DIAGNOSIS — S52572A Other intraarticular fracture of lower end of left radius, initial encounter for closed fracture: Secondary | ICD-10-CM | POA: Insufficient documentation

## 2022-12-22 DIAGNOSIS — S52502A Unspecified fracture of the lower end of left radius, initial encounter for closed fracture: Secondary | ICD-10-CM | POA: Diagnosis not present

## 2022-12-22 DIAGNOSIS — S6992XA Unspecified injury of left wrist, hand and finger(s), initial encounter: Secondary | ICD-10-CM | POA: Diagnosis not present

## 2022-12-22 DIAGNOSIS — S52392A Other fracture of shaft of radius, left arm, initial encounter for closed fracture: Secondary | ICD-10-CM | POA: Diagnosis not present

## 2022-12-22 MED ORDER — LIDOCAINE HCL (PF) 1 % IJ SOLN
5.0000 mL | Freq: Once | INTRAMUSCULAR | Status: AC
  Administered 2022-12-22: 5 mL via INTRADERMAL
  Filled 2022-12-22: qty 5

## 2022-12-22 MED ORDER — BUPIVACAINE HCL 0.5 % IJ SOLN
5.0000 mL | Freq: Once | INTRAMUSCULAR | Status: DC
  Filled 2022-12-22: qty 5

## 2022-12-22 MED ORDER — HYDROCODONE-ACETAMINOPHEN 5-325 MG PO TABS: 1.0000 | ORAL_TABLET | Freq: Four times a day (QID) | ORAL | 0 refills | Status: DC | PRN

## 2022-12-22 MED ORDER — BUPIVACAINE HCL (PF) 0.5 % IJ SOLN
5.0000 mL | Freq: Once | INTRAMUSCULAR | Status: AC
  Administered 2022-12-22: 5 mL
  Filled 2022-12-22: qty 10

## 2022-12-22 MED ORDER — HYDROCODONE-ACETAMINOPHEN 5-325 MG PO TABS
1.0000 | ORAL_TABLET | ORAL | Status: AC
  Administered 2022-12-22: 1 via ORAL
  Filled 2022-12-22: qty 1

## 2022-12-22 NOTE — ED Triage Notes (Addendum)
Pt c/o L hand/wrist pain after she jumped on her shovel and she fell and hurt her L wrist, pulses are good. No obvious deformity noted and pt has it in a sling.

## 2022-12-22 NOTE — ED Provider Notes (Signed)
[place] EMERGENCY DEPARTMENT AT [facility] Provider Note   CSN: [id number] Arrival date & time: 12/22/22  2007     History  Chief Complaint  Patient presents with   Hand Injury    L   Wrist Pain    L    Karen Prince is a 67 y.o. female.  Presents to the emergency department valuation of left wrist injury.  Patient fell backwards and fell on her outstretched left hand just prior to arrival.  She was digging a hole in the ground and accidentally fell backwards.  No headache, head injury, LOC, nausea or vomiting.  She complains of moderate to severe left wrist pain with no numbness or tingling.  Patient has been ambulatory.  No lower extremity discomfort  HPI     Home Medications Prior to Admission medications   Medication Sig Start Date End Date Taking? Authorizing Provider  HYDROcodone-acetaminophen (NORCO) 5-325 MG tablet Take 1 tablet by mouth every 6 (six) hours as needed for moderate pain. 12/22/22  Yes Evon Slack, PA-C  atorvastatin (LIPITOR) 20 MG tablet TAKE 1 TABLET BY MOUTH DAILY FOR CHOLESTEROL 11/01/22   Doreene Nest, NP  buPROPion [facility] SR) 150 MG 12 hr tablet Take 1 tablet (150 mg total) by mouth daily. For depression 06/04/22   Doreene Nest, NP  CALCIUM PO Take 860 mg by mouth daily.    [provider]  Cholecalciferol (VITAMIN D3) 2000 UNITS capsule Take 2,000 Units by mouth every other day.     [provider]  DULoxetine (CYMBALTA) 60 MG capsule Take 1 capsule (60 mg total) by mouth daily. For depression and anxiety. 03/14/22   Doreene Nest, NP  Melatonin 3 MG TABS Take 3 mg by mouth.    [provider]  Omega-3 Fatty Acids (FISH OIL) 1000 MG CAPS Take by mouth daily.    [provider]  vitamin B-12 (CYANOCOBALAMIN) 100 MCG tablet Take 100 mcg by mouth daily.    [provider]  zaleplon (SONATA) 10 MG capsule Take by mouth.    [provider]      Allergies     Patient has no known allergies.    Review of Systems   Review of Systems  Physical Exam Updated Vital Signs BP 134/82   Pulse 75   Temp 98.5 F (36.9 C)   Resp 18   SpO2 96%  Physical Exam Constitutional:      Appearance: She is well-developed.  HENT:     Head: Normocephalic and atraumatic.  Eyes:     Conjunctiva/sclera: Conjunctivae normal.  Cardiovascular:     Rate and Rhythm: Normal rate.  Pulmonary:     Effort: Pulmonary effort is normal. No respiratory distress.  Musculoskeletal:        General: Normal range of motion.     Cervical back: Normal range of motion.     Comments: Left wrist with mild deformity.  Neurovascular intact.  No skin breakdown noted.  Nontender throughout the proximal forearm elbow or shoulder.  Skin:    General: Skin is warm.     Findings: No rash.  Neurological:     Mental Status: She is alert and oriented to person, place, and time.  Psychiatric:        Behavior: Behavior normal.        Thought Content: Thought content normal.     ED Results / Procedures / Treatments   Labs (all labs ordered are listed, but only  abnormal results are displayed) Labs Reviewed - No data to display  EKG None  Radiology DG Wrist 2 Views Left  Result Date: 12/22/2022 CLINICAL DATA:  [id number] Injury [id number] EXAM: LEFT WRIST - 2 VIEW COMPARISON:  X-ray left wrist 12/22/2022 FINDINGS: Status post cast placement. Poorly visualized known distal radius acute intra-articular displaced fracture. No dislocation. First digit carpometacarpal degenerative changes. There is no evidence of severe arthropathy or other focal bone abnormality. Subcutaneus soft tissue edema. IMPRESSION: Poorly visualized known distal radius acute intra-articular displaced fracture. Electronically Signed   By: Tish Frederickson M.D.   On: 12/22/2022 22:28   DG Wrist Complete Left  Result Date: 12/22/2022 CLINICAL DATA:  Status post trauma. EXAM: LEFT WRIST - COMPLETE 3+ VIEW COMPARISON:  None  Available. FINDINGS: An acute, comminuted, mildly impacted fracture deformity is seen involving the distal left radius. Extension to involve the radiocarpal articulation is noted. There is no evidence of dislocation. There is no evidence of significant arthropathy or other focal bone abnormality. Diffuse soft tissue swelling is seen. IMPRESSION: Acute fracture of the distal left radius. Electronically Signed   By: Aram Candela M.D.   On: 12/22/2022 20:44    Procedures .Ortho Injury Treatment  Date/Time: 12/22/2022 11:03 PM  Performed by: Evon Slack, PA-C Authorized by: Evon Slack, PA-C   Consent:    Consent obtained:  Verbal   Consent given by:  Patient   Risks discussed:  Nerve damageInjury location: wrist Location details: left wrist Injury type: fracture Fracture type: distal radius Pre-procedure neurovascular assessment: neurovascularly intact Pre-procedure distal perfusion: normal Pre-procedure neurological function: normal Pre-procedure range of motion: reduced Anesthesia: hematoma block  Anesthesia: Local anesthesia used: yes Local Anesthetic: lidocaine 1% without epinephrine and bupivacaine 0.5% without epinephrine Anesthetic total: 8 mL  Patient sedated: NoManipulation performed: yes Skin traction used: yes Skeletal traction used: yes Reduction successful: yes X-ray confirmed reduction: yes Immobilization: splint Splint type: sugar tong Splint Applied by: ED Provider Supplies used: aluminum splint, cotton padding and elastic bandage Post-procedure neurovascular assessment: post-procedure neurovascularly intact Post-procedure distal perfusion: normal Post-procedure neurological function: normal Post-procedure range of motion: normal       Medications Ordered in ED Medications  lidocaine (PF) (XYLOCAINE) 1 % injection 5 mL (5 mLs Intradermal Given by Other 12/22/22 2119)  HYDROcodone-acetaminophen (NORCO/VICODIN) 5-325 MG per tablet 1 tablet (1  tablet Oral Given 12/22/22 2119)  bupivacaine(PF) (MARCAINE) 0.5 % injection 5 mL (5 mLs Infiltration Given 12/22/22 2132)    ED Course/ Medical Decision Making/ A&P                             Medical Decision Making Amount and/or Complexity of Data Reviewed Radiology: ordered.  Risk Prescription drug management.   67 year old female with fall just prior to arrival.  She suffered a mechanical fall while an outstretched left hand.  She suffered a displaced left distal radius fracture with dorsal impaction.  Patient neurovascular intact.  Patient agreed and consented to a left wrist hematoma block with closed reduction and splinting.  Patient tolerated hematoma block and reduction procedure well.  Postreduction films showed improved near-anatomic alignment of the distal radius fracture.  Patient placed into a sling.  She is given Norco for pain.  Tylenol and ibuprofen for mild pain.  She will call orthopedic office in 2 days to schedule follow-up appointment.  She is given instructions to keep splint clean and dry.  She will loosen the splint  if she needs for any swelling or discomfort. Final Clinical Impression(s) / ED Diagnoses Final diagnoses:  Other closed intra-articular fracture of distal end of left radius, initial encounter    Rx / DC Orders ED Discharge Orders          Ordered    HYDROcodone-acetaminophen (NORCO) 5-325 MG tablet  Every 6 hours PRN        12/22/22 2234              Ronnette Juniper 12/22/22 2305    Willy Eddy, MD 12/25/22 1041

## 2022-12-22 NOTE — Discharge Instructions (Signed)
Use sling as needed for comfort.  Keep the left hand elevated.  You to work on digit range of motion of the left hand.  Call orthopedic office Tuesday to schedule follow-up appointment.  He may use Norco as needed for moderate to severe pain.  Use Tylenol/ibuprofen as needed for mild pain.

## 2022-12-24 DIAGNOSIS — S52572A Other intraarticular fracture of lower end of left radius, initial encounter for closed fracture: Secondary | ICD-10-CM | POA: Diagnosis not present

## 2022-12-26 ENCOUNTER — Telehealth: Payer: Self-pay | Admitting: *Deleted

## 2022-12-26 NOTE — Telephone Encounter (Signed)
Transition Care Management Unsuccessful Follow-up Telephone Call  Date of discharge and from where:  [facility] 12/21/2022  Attempts:  1st Attempt  Reason for unsuccessful TCM follow-up call:    patient in teleconference and requested call back tomorrow

## 2022-12-27 ENCOUNTER — Telehealth: Payer: Self-pay | Admitting: *Deleted

## 2022-12-27 NOTE — Telephone Encounter (Signed)
Transition Care Management Follow-up Telephone Call Date of discharge and from where: [facility] 5/56/024 How have you been since you were released from the hospital? Feeling better has notified pcp and has seen ortho dr Any questions or concerns? No  Items Reviewed: Did the pt receive and understand the discharge instructions provided? Yes  Medications obtained and verified? No  Other? No  Any new allergies since your discharge? No  Dietary orders reviewed? No Do you have support at home? Yes      Follow up appointments reviewed:  PCP Hospital f/u appt confirmed? Yes  Notified  Specialist Hospital f/u appt confirmed? Yes  seen orthopedist  Are transportation arrangements needed? No  If their condition worsens, is the pt aware to call PCP or go to the Emergency Dept.? Yes Was the patient provided with contact information for the PCP's office or ED? Yes Was to pt encouraged to call back with questions or concerns? Yes    Alois Cliche -Berneda Rose [facility] [place], [facility] [phone] [address] [place] , [place] [address] Email : [email or web address]    .j

## 2022-12-30 DIAGNOSIS — S52572A Other intraarticular fracture of lower end of left radius, initial encounter for closed fracture: Secondary | ICD-10-CM | POA: Diagnosis not present

## 2023-01-06 DIAGNOSIS — S52572A Other intraarticular fracture of lower end of left radius, initial encounter for closed fracture: Secondary | ICD-10-CM | POA: Diagnosis not present

## 2023-01-20 DIAGNOSIS — S52572D Other intraarticular fracture of lower end of left radius, subsequent encounter for closed fracture with routine healing: Secondary | ICD-10-CM | POA: Diagnosis not present

## 2023-02-10 DIAGNOSIS — S52572D Other intraarticular fracture of lower end of left radius, subsequent encounter for closed fracture with routine healing: Secondary | ICD-10-CM | POA: Diagnosis not present

## 2023-02-17 ENCOUNTER — Encounter: Payer: Self-pay | Admitting: Occupational Therapy

## 2023-02-17 ENCOUNTER — Ambulatory Visit: Payer: Medicare Other | Attending: Student | Admitting: Occupational Therapy

## 2023-02-17 DIAGNOSIS — M25532 Pain in left wrist: Secondary | ICD-10-CM | POA: Diagnosis not present

## 2023-02-17 DIAGNOSIS — M6281 Muscle weakness (generalized): Secondary | ICD-10-CM | POA: Diagnosis not present

## 2023-02-17 DIAGNOSIS — M25632 Stiffness of left wrist, not elsewhere classified: Secondary | ICD-10-CM | POA: Diagnosis not present

## 2023-02-17 NOTE — Therapy (Signed)
[facility] [address] [place], [place], [address] Phone: [phone]   Fax:  [phone]  Occupational Therapy Evaluation  Patient Details  Name: Karen Prince MRN: [id number] Date of Birth: Apr 06, 1956 Referring Provider (OT): Ramona PA   Encounter Date: 02/17/2023   OT End of Session - 02/17/23 1340     Visit Number 1    Number of Visits 10    Date for OT Re-Evaluation 03/31/23    OT Start Time 1123    OT Stop Time 1205    OT Time Calculation (min) 42 min    Activity Tolerance Patient tolerated treatment well    Behavior During Therapy [facility] for tasks assessed/performed             Past Medical History:  Diagnosis Date   ADHD (attention deficit hyperactivity disorder)    Anemia    Chicken pox    Depression    Essential tremor    Hearing loss    follows with audologist   IBS (irritable bowel syndrome)    Insomnia    Leucopenia    has blood draw every 5 years   Measles    Mitral valve prolapse    Palpitations    PVC (premature ventricular contraction)    Urinary incontinence     Past Surgical History:  Procedure Laterality Date   COLONOSCOPY WITH PROPOFOL N/A 01/04/2016   Procedure: COLONOSCOPY WITH PROPOFOL;  Surgeon: Scot Jun, MD;  Location: [facility] ENDOSCOPY;  Service: Endoscopy;  Laterality: N/A;   COLONOSCOPY WITH PROPOFOL N/A 06/10/2022   Procedure: COLONOSCOPY WITH PROPOFOL;  Surgeon: Toney Reil, MD;  Location: [facility] ENDOSCOPY;  Service: Gastroenterology;  Laterality: N/A;   INGUINAL HERNIA REPAIR Right 1984   surgery performed to remove endometrial mass and hernai was found secondary   KNEE ARTHROSCOPY Right 1993    There were no vitals filed for this visit.   Subjective Assessment - 02/17/23 1331     Subjective  Fell while doing yard work - doing okay -fingers stiff in the am - no swelling really except wrist- need more wrist motion and strength - hurts when picking up or  lifting    Pertinent History 02/10/23 Ortho note- Karen Prince is a 67 y.o. female who presents today for cast removal and repeat x-rays of her left wrist. The patient suffered a displaced left distal radius fracture and did undergo a closed reduction at the emergency room. Initial date of injury is 12/22/2022. The patient was kept in a sugar-tong splint initially, she was evaluated on 12/30/2022 where she was placed into a long-arm cast with the wrist in a flexed position. She was last evaluated on 01/20/2023. At this appointment the patient was transition from a long-arm cast into a short arm cast. She presents today for cast removal and repeat x-rays of the right wrist. The patient reports a 0 out of 10 pain score at today's appointment. She denies any repeat trauma or injury affecting the left upper extremity since her last evaluation- cast removed and put in prefab wrist splint    Patient Stated Goals Want my motion and strength back to work in yard, around the house - turn my palm up , carry and lift objects without pain    Currently in Pain? Yes    Pain Score 3     Pain Location Wrist    Pain Orientation Left    Pain Descriptors / Indicators Tightness;Sore;Aching    Pain  Type Acute pain    Pain Onset More than a month ago    Pain Frequency Intermittent               [facility] OT Assessment - 02/17/23 0001       Assessment   Medical Diagnosis L distal radius fx    Referring Provider (OT) Marney Doctor PA    Onset Date/Surgical Date 12/22/22    Hand Dominance Right      Precautions   Required Braces or Orthoses --   prefab wrist splint out and about - heavy objects     Home  Environment   Lives With Significant other      Prior Function   Vocation Retired   PT   Leisure yard work cooking and hous work      AROM   Left Forearm Pronation 90 Degrees    Left Forearm Supination 65 Degrees    Left Wrist Extension 50 Degrees    Left Wrist Flexion 50 Degrees    Left Wrist Radial Deviation  18 Degrees    Left Wrist Ulnar Deviation 20 Degrees      Strength   Right Hand Grip (lbs) 65    Right Hand Lateral Pinch 15 lbs    Right Hand 3 Point Pinch 14 lbs    Left Hand Grip (lbs) 26    Left Hand Lateral Pinch 10 lbs    Left Hand 3 Point Pinch 8 lbs      Left Hand AROM   L Thumb Opposition to Index --   opposition to base of 5th -pull over thumb                     OT Treatments/Exercises (OP) - 02/17/23 0001       LUE Fluidotherapy   Number Minutes Fluidotherapy 8 Minutes    LUE Fluidotherapy Location Hand;Wrist    Comments AROM in all planes - decrease stiffness prior to review of HEP             Review with pt HEP -after heat  AAROM and PROM for L wrist flexion, ext, RD, UD  As well as 1lbs weight or 16oz hammer close to head-  Sup/pro, flexion, ext and RD, UD  12 reps  2 x day  Pain free  Increase in 2-3 days if no pain to 2nd set  And then by weekend if no pain 3rd set   2 x day        OT Education - 02/17/23 1339     Education Details findings of eval and HEP    Person(s) Educated Patient    Methods Explanation;Demonstration;Tactile cues;Verbal cues;Handout    Comprehension Verbal cues required;Returned demonstration;Verbalized understanding                 OT Long Term Goals - 02/17/23 1345       OT LONG TERM GOAL #1   Title Pt to be ind in HEP to increase L wrist and forearm AROM to [facility] to carry objects on palm and forearm, wash back without increase symptoms.    Baseline sup 65, wrist flexion , ext 50 degrees- pain with carrying or lifting    Time 4    Period Weeks    Status New    Target Date 03/17/23      OT LONG TERM GOAL #2   Title L wrist AROM and strenght WNL for pt to turn doorknob , push and pull heavy  door without increase symptoms or issues    Baseline 7 wks s/p decrease AROM sup 65, flexion, ext 50 , RD 18 and UD 20- pain with use past motion - no strengthening    Time 6    Period Weeks    Status New     Target Date 03/31/23      OT LONG TERM GOAL #3   Title L grip and prehension strenght increase to more than 60% compare to R to carry groceries more than 8 lbs, hold plate, cut food without increase symptoms    Baseline Grip R 65, L 26lbs, Lat pinch R 15, L 10 and 3 point R 14, L 8 lbs- pain when lifting , carrying -and    Time 6    Period Weeks    Status New    Target Date 03/31/23                   Plan - 02/17/23 1341     Clinical Impression Statement Pt present at OT evaluation with diagnosis of L distal radius fx with reduction in ED on 12/22/22. Pt now 7 wks out from injury- pt arrive with prefab wrist splint in place - wearing it when out and about. Pt show decrease L wrist and forearm AROM , decrease strength as well as increase pain with functional use. Grip and prehension strength decrease - pt limited in functional use of L hand and wrist in ADL's and IADL"s- pt can benefit from skilled OT services to return to prior level of function.    OT Occupational Profile and History Problem Focused Assessment - Including review of records relating to presenting problem    Occupational performance deficits (Please refer to evaluation for details): ADL's;IADL's;Play;Social Participation;Leisure    Body Structure / Function / Physical Skills ADL;IADL;Pain;Strength;ROM;UE functional use;Flexibility    Rehab Potential Good    Clinical Decision Making Limited treatment options, no task modification necessary    Comorbidities Affecting Occupational Performance: None    Modification or Assistance to Complete Evaluation  No modification of tasks or assist necessary to complete eval    OT Frequency 1x / week   increase to 2 x wk if not progressing   OT Duration 6 weeks    OT Treatment/Interventions Self-care/ADL training;Contrast Bath;DME and/or AE instruction;Manual Therapy;Passive range of motion;Fluidtherapy;Paraffin;Splinting;Patient/family education;Therapeutic exercise    Consulted  and Agree with Plan of Care Patient             Patient will benefit from skilled therapeutic intervention in order to improve the following deficits and impairments:   Body Structure / Function / Physical Skills: ADL, IADL, Pain, Strength, ROM, UE functional use, Flexibility       Visit Diagnosis: Stiffness of left wrist, not elsewhere classified  Muscle weakness (generalized)  Pain in left wrist    Problem List Patient Active Problem List   Diagnosis Date Noted   History of colonic polyps 06/10/2022   Polyp of descending colon 06/10/2022   Palpitations 09/08/2019   Urinary incontinence 06/28/2016   Preventative health care 06/28/2016   Insomnia 10/26/2015   Depression 04/28/2015   Attention deficit hyperactivity disorder 04/28/2015    Oletta Cohn, OTR/L,CLT 02/17/2023, 1:50 PM  [facility] [address] [place], [address] Phone: [phone]   Fax:  [phone]  Name: Mirayah Wren MRN: [id number] Date of Birth: 1955/10/20

## 2023-02-24 ENCOUNTER — Ambulatory Visit: Payer: Medicare Other | Admitting: Occupational Therapy

## 2023-02-24 DIAGNOSIS — M25532 Pain in left wrist: Secondary | ICD-10-CM

## 2023-02-24 DIAGNOSIS — M6281 Muscle weakness (generalized): Secondary | ICD-10-CM

## 2023-02-24 DIAGNOSIS — K080 Exfoliation of teeth due to systemic causes: Secondary | ICD-10-CM | POA: Diagnosis not present

## 2023-02-24 DIAGNOSIS — M25632 Stiffness of left wrist, not elsewhere classified: Secondary | ICD-10-CM

## 2023-02-24 NOTE — Therapy (Signed)
[facility] [address] [place], [place], [address] Phone: [phone]   Fax:  [phone]  Occupational Therapy Treatment  Patient Details  Name: Karen Prince MRN: [id number] Date of Birth: 06/06/1956 Referring Provider (OT): Esparto PA   Encounter Date: 02/24/2023   OT End of Session - 02/24/23 1651     Visit Number 2    Number of Visits 10    OT Start Time 1602    OT Stop Time 1645    OT Time Calculation (min) 43 min    Activity Tolerance Patient tolerated treatment well    Behavior During Therapy [facility] for tasks assessed/performed             Past Medical History:  Diagnosis Date   ADHD (attention deficit hyperactivity disorder)    Anemia    Chicken pox    Depression    Essential tremor    Hearing loss    follows with audologist   IBS (irritable bowel syndrome)    Insomnia    Leucopenia    has blood draw every 5 years   Measles    Mitral valve prolapse    Palpitations    PVC (premature ventricular contraction)    Urinary incontinence     Past Surgical History:  Procedure Laterality Date   COLONOSCOPY WITH PROPOFOL N/A 01/04/2016   Procedure: COLONOSCOPY WITH PROPOFOL;  Surgeon: Scot Jun, MD;  Location: [facility] ENDOSCOPY;  Service: Endoscopy;  Laterality: N/A;   COLONOSCOPY WITH PROPOFOL N/A 06/10/2022   Procedure: COLONOSCOPY WITH PROPOFOL;  Surgeon: Toney Reil, MD;  Location: [facility] ENDOSCOPY;  Service: Gastroenterology;  Laterality: N/A;   INGUINAL HERNIA REPAIR Right 1984   surgery performed to remove endometrial mass and hernai was found secondary   KNEE ARTHROSCOPY Right 1993    There were no vitals filed for this visit.   Subjective Assessment - 02/24/23 1650     Pertinent History 02/10/23 Ortho note- Karen Prince is a 67 y.o. female who presents today for cast removal and repeat x-rays of her left wrist. The patient suffered a displaced left distal radius fracture and did  undergo a closed reduction at the emergency room. Initial date of injury is 12/22/2022. The patient was kept in a sugar-tong splint initially, she was evaluated on 12/30/2022 where she was placed into a long-arm cast with the wrist in a flexed position. She was last evaluated on 01/20/2023. At this appointment the patient was transition from a long-arm cast into a short arm cast. She presents today for cast removal and repeat x-rays of the right wrist. The patient reports a 0 out of 10 pain score at today's appointment. She denies any repeat trauma or injury affecting the left upper extremity since her last evaluation- cast removed and put in prefab wrist splint    Patient Stated Goals Want my motion and strength back to work in yard, around the house - turn my palm up , carry and lift objects without pain    Currently in Pain? Yes    Pain Score 3     Pain Location Wrist    Pain Orientation Left    Pain Descriptors / Indicators Aching    Pain Type Acute pain    Pain Onset More than a month ago    Pain Frequency Intermittent    Aggravating Factors  End range sup                [facility] OT  Assessment - 02/24/23 0001       AROM   Left Forearm Pronation 90 Degrees    Left Forearm Supination 75 Degrees    Left Wrist Extension 65 Degrees    Left Wrist Flexion 60 Degrees    Left Wrist Radial Deviation 25 Degrees    Left Wrist Ulnar Deviation 20 Degrees      Strength   Right Hand Grip (lbs) 65    Right Hand Lateral Pinch 15 lbs    Right Hand 3 Point Pinch 14 lbs    Left Hand Grip (lbs) 34    Left Hand Lateral Pinch 11 lbs    Left Hand 3 Point Pinch 9 lbs             Patient made some good progress since last week with home program Mostly limited with pain with supination end range Wrist extension improvement more than flexion         OT Treatments/Exercises (OP) - 02/24/23 0001       LUE Fluidotherapy   Number Minutes Fluidotherapy 8 Minutes    LUE Fluidotherapy Location  Hand;Wrist    Comments AROM for wrist in all planes            Soft tissue massage doing metacarpal spreads as well as webspace Followed by Graston tool #2 for sweeping and brushing over radial and volar forearm and wrist prior to active assisted and passive range of motion  AAROM and PROM for L wrist flexion, ext, RD, UD , supination reviewed with patient again Good progress after fluidotherapy and Graston Patient to continue with 1lbs weight or 16oz hammer close to head-  Sup/pro, flexion, ext and RD, UD  12 reps, 3 sets until the end of the week  2 x day  Pain free Can increase to 2 pounds for radial ulnar deviation and supination pronation at the end of the week if pain-free But decreased to 12 reps 1 set 2 times a day Recommend for her to continue with 1 pound for wrist flexion extension Add and reviewed teal medium putty for grip and 3-point and lateral pinch 12 reps 2 times a day If pain-free can increase to a second set in 3 days and a third set in 6 days       OT Education - 02/24/23 1651     Education Details progress and HEP changes    Person(s) Educated Patient    Methods Explanation;Demonstration;Tactile cues;Verbal cues;Handout    Comprehension Verbal cues required;Returned demonstration;Verbalized understanding                 OT Long Term Goals - 02/17/23 1345       OT LONG TERM GOAL #1   Title Pt to be ind in HEP to increase L wrist and forearm AROM to [facility] to carry objects on palm and forearm, wash back without increase symptoms.    Baseline sup 65, wrist flexion , ext 50 degrees- pain with carrying or lifting    Time 4    Period Weeks    Status New    Target Date 03/17/23      OT LONG TERM GOAL #2   Title L wrist AROM and strenght WNL for pt to turn doorknob , push and pull heavy door without increase symptoms or issues    Baseline 7 wks s/p decrease AROM sup 65, flexion, ext 50 , RD 18 and UD 20- pain with use past motion - no strengthening  Time 6    Period Weeks    Status New    Target Date 03/31/23      OT LONG TERM GOAL #3   Title L grip and prehension strenght increase to more than 60% compare to R to carry groceries more than 8 lbs, hold plate, cut food without increase symptoms    Baseline Grip R 65, L 26lbs, Lat pinch R 15, L 10 and 3 point R 14, L 8 lbs- pain when lifting , carrying -and    Time 6    Period Weeks    Status New    Target Date 03/31/23                   Plan - 02/24/23 1652     Clinical Impression Statement Pt present at OT evaluation with diagnosis of L distal radius fx with reduction in ED on 12/22/22. Pt 8 wks out from injury-  Pt arrive with Benik neoprene  splint on - made good progress since last week in wrist and forearm in all planes - as well as grip and prehension- pt to stay at 1 lbs until end of week and then can incease to 2 lbs for RD, UD and sup /pro -but keep at 1 lbs for wrist extention and flexion. Add teal med putty for grip and prehension - 12 reps - 2 x day - increase gradually symptoms free over the week.  Pt cont to show decrease L wrist and forearm AROM , decrease strength as well as increase pain with functional use. Grip and prehension strength decrease - pt limited in functional use of L hand and wrist in ADL's and IADL"s- pt can benefit from skilled OT services to return to prior level of function.    OT Occupational Profile and History Problem Focused Assessment - Including review of records relating to presenting problem    Occupational performance deficits (Please refer to evaluation for details): ADL's;IADL's;Play;Social Participation;Leisure    Body Structure / Function / Physical Skills ADL;IADL;Pain;Strength;ROM;UE functional use;Flexibility    Rehab Potential Good    Clinical Decision Making Limited treatment options, no task modification necessary    Comorbidities Affecting Occupational Performance: None    Modification or Assistance to Complete Evaluation   No modification of tasks or assist necessary to complete eval    OT Frequency 1x / week    OT Duration 6 weeks    OT Treatment/Interventions Self-care/ADL training;Contrast Bath;DME and/or AE instruction;Manual Therapy;Passive range of motion;Fluidtherapy;Paraffin;Splinting;Patient/family education;Therapeutic exercise    Consulted and Agree with Plan of Care Patient             Patient will benefit from skilled therapeutic intervention in order to improve the following deficits and impairments:   Body Structure / Function / Physical Skills: ADL, IADL, Pain, Strength, ROM, UE functional use, Flexibility       Visit Diagnosis: Stiffness of left wrist, not elsewhere classified  Pain in left wrist  Muscle weakness (generalized)    Problem List Patient Active Problem List   Diagnosis Date Noted   History of colonic polyps 06/10/2022   Polyp of descending colon 06/10/2022   Palpitations 09/08/2019   Urinary incontinence 06/28/2016   Preventative health care 06/28/2016   Insomnia 10/26/2015   Depression 04/28/2015   Attention deficit hyperactivity disorder 04/28/2015    Oletta Cohn, OTR/L,CLT 02/24/2023, 4:55 PM  [facility] [address], [place], [address] Phone: [phone]   Fax:  [phone]  Name: Rachelann Madrazo MRN: [id number] Date of Birth: 01/11/1956

## 2023-02-27 ENCOUNTER — Other Ambulatory Visit: Payer: Self-pay | Admitting: Primary Care

## 2023-02-27 DIAGNOSIS — F3342 Major depressive disorder, recurrent, in full remission: Secondary | ICD-10-CM

## 2023-02-27 NOTE — Telephone Encounter (Signed)
Patient scheduled.

## 2023-02-27 NOTE — Telephone Encounter (Signed)
Patient is due for CPE/follow up in late August, this will be required prior to any further refills.  Please schedule, thank you!   

## 2023-03-05 ENCOUNTER — Ambulatory Visit: Payer: Medicare Other | Attending: Student | Admitting: Occupational Therapy

## 2023-03-05 DIAGNOSIS — M6281 Muscle weakness (generalized): Secondary | ICD-10-CM | POA: Insufficient documentation

## 2023-03-05 DIAGNOSIS — M25532 Pain in left wrist: Secondary | ICD-10-CM | POA: Insufficient documentation

## 2023-03-05 DIAGNOSIS — M25632 Stiffness of left wrist, not elsewhere classified: Secondary | ICD-10-CM | POA: Diagnosis not present

## 2023-03-06 ENCOUNTER — Encounter: Payer: Self-pay | Admitting: Occupational Therapy

## 2023-03-06 NOTE — Therapy (Signed)
[facility] [address] [place], [place], [address] Phone: [phone]   Fax:  [phone]  Occupational Therapy Treatment  Patient Details  Name: Karen Prince MRN: [id number] Date of Birth: [birth year] Referring Provider (OT): [place] PA   Encounter Date: 03/05/2023   OT End of Session - 03/06/23 0854     Visit Number 3    Number of Visits 10    Date for OT Re-Evaluation 03/31/23    OT Start Time 1612    OT Stop Time 1651    OT Time Calculation (min) 39 min    Activity Tolerance Patient tolerated treatment well    Behavior During Therapy [facility] for tasks assessed/performed             Past Medical History:  Diagnosis Date   ADHD (attention deficit hyperactivity disorder)    Anemia    Chicken pox    Depression    Essential tremor    Hearing loss    follows with audologist   IBS (irritable bowel syndrome)    Insomnia    Leucopenia    has blood draw every 5 years   Measles    Mitral valve prolapse    Palpitations    PVC (premature ventricular contraction)    Urinary incontinence     Past Surgical History:  Procedure Laterality Date   COLONOSCOPY WITH PROPOFOL N/A 01/04/2016   Procedure: COLONOSCOPY WITH PROPOFOL;  Surgeon: Scot Jun, MD;  Location: [facility] ENDOSCOPY;  Service: Endoscopy;  Laterality: N/A;   COLONOSCOPY WITH PROPOFOL N/A 06/10/2022   Procedure: COLONOSCOPY WITH PROPOFOL;  Surgeon: Toney Reil, MD;  Location: [facility] ENDOSCOPY;  Service: Gastroenterology;  Laterality: N/A;   INGUINAL HERNIA REPAIR Right 1984   surgery performed to remove endometrial mass and hernai was found secondary   KNEE ARTHROSCOPY Right 1993    There were no vitals filed for this visit.   Subjective Assessment - 03/06/23 1014     Subjective  Doing better-using it more even in the yard.  Still working on that bending as well as supination endrange.  I am doing okay with a 2 pound - the  thumbs do hurt a  little bit with a putty    Pertinent History 02/10/23 Ortho note- [name] is a 67 y.o. female who presents today for cast removal and repeat x-rays of her left wrist. The patient suffered a displaced left distal radius fracture and did undergo a closed reduction at the emergency room. Initial date of injury is 12/22/2022. The patient was kept in a sugar-tong splint initially, she was evaluated on 12/30/2022 where she was placed into a long-arm cast with the wrist in a flexed position. She was last evaluated on 01/20/2023. At this appointment the patient was transition from a long-arm cast into a short arm cast. She presents today for cast removal and repeat x-rays of the right wrist. The patient reports a 0 out of 10 pain score at today's appointment. She denies any repeat trauma or injury affecting the left upper extremity since her last evaluation- cast removed and put in prefab wrist splint    Patient Stated Goals Want my motion and strength back to work in yard, around the house - turn my palm up , carry and lift objects without pain    Currently in Pain? Yes    Pain Score 2     Pain Location Wrist    Pain Orientation --   ulnar side  end range sup                      OT Treatments/Exercises (OP) - 03/06/23 0001       LUE Fluidotherapy   Number Minutes Fluidotherapy 8 Minutes    LUE Fluidotherapy Location Hand;Wrist    Comments AROM for wrist in all planes prior to ROM            Soft tissue massage doing metacarpal spreads as well as webspace Followed by Graston tool #2 for sweeping and brushing over  palm and volar digits - stiffness reported in 3rd and 4th prior to active assisted and passive range of motion   AAROM and PROM for L wrist flexion and over edge of table RD and focus on  UD , and supination end range by OT  Good progress after fluidotherapy  Add after AAROM and PROM table slides over towel with CMC relax - 20reps pain free - slight pull  Followed by  wall pushup - pain free 5 reps Add to HEP Patient to continue with 2 lbs weight  Sup/pro, flexion, ext and RD, UD  12 reps, 2 sets until the end of the week  2 x day  Pain free Can increase to  3rd set early next week- focus still on ROM   Upgrade to med firm green putty for grip  12 reps 2 times a day If pain-free can increase to a second set in 3 days and a third set in 6 days Symptoms free               OT Education - 03/06/23 1016     Education Details progress and HEP changes    Person(s) Educated Patient    Methods Explanation;Demonstration;Tactile cues;Verbal cues;Handout    Comprehension Verbal cues required;Returned demonstration;Verbalized understanding                 OT Long Term Goals - 02/17/23 1345       OT LONG TERM GOAL #1   Title Pt to be ind in HEP to increase L wrist and forearm AROM to [facility] to carry objects on palm and forearm, wash back without increase symptoms.    Baseline sup 65, wrist flexion , ext 50 degrees- pain with carrying or lifting    Time 4    Period Weeks    Status New    Target Date 03/17/23      OT LONG TERM GOAL #2   Title L wrist AROM and strenght WNL for pt to turn doorknob , push and pull heavy door without increase symptoms or issues    Baseline 7 wks s/p decrease AROM sup 65, flexion, ext 50 , RD 18 and UD 20- pain with use past motion - no strengthening    Time 6    Period Weeks    Status New    Target Date 03/31/23      OT LONG TERM GOAL #3   Title L grip and prehension strenght increase to more than 60% compare to R to carry groceries more than 8 lbs, hold plate, cut food without increase symptoms    Baseline Grip R 65, L 26lbs, Lat pinch R 15, L 10 and 3 point R 14, L 8 lbs- pain when lifting , carrying -and    Time 6    Period Weeks    Status New    Target Date 03/31/23  Plan - 03/06/23 1016     Clinical Impression Statement Pt present at OT evaluation with diagnosis of L  distal radius fx with reduction in ED on 12/22/22. Pt 10  wks out from injury-  Pt arrive with Benik neoprene  splint on - cont to make great progress  in AROM for L wrist and forearm in all planes - as well as grip and prehension- pt did increase to 2 lbs -limited mostly in flexion and end range supination. Upgrade to [employer] green putty for grip but pt in range for her prehension- with her thumb CMC OA - to hold of and focus on grip. Pt can initiate table slides for wrist extention prior to attempts for wall pushups symptoms free. Pt limited in functional use of L hand and wrist in ADL's and IADL"s- pt can benefit from skilled OT services to return to prior level of function.    OT Occupational Profile and History Problem Focused Assessment - Including review of records relating to presenting problem    Occupational performance deficits (Please refer to evaluation for details): ADL's;IADL's;Play;Social Participation;Leisure    Body Structure / Function / Physical Skills ADL;IADL;Pain;Strength;ROM;UE functional use;Flexibility    Rehab Potential Good    Clinical Decision Making Limited treatment options, no task modification necessary    Comorbidities Affecting Occupational Performance: None    Modification or Assistance to Complete Evaluation  No modification of tasks or assist necessary to complete eval    OT Frequency 1x / week    OT Duration 6 weeks    OT Treatment/Interventions Self-care/ADL training;Contrast Bath;DME and/or AE instruction;Manual Therapy;Passive range of motion;Fluidtherapy;Paraffin;Splinting;Patient/family education;Therapeutic exercise    Consulted and Agree with Plan of Care Patient             Patient will benefit from skilled therapeutic intervention in order to improve the following deficits and impairments:   Body Structure / Function / Physical Skills: ADL, IADL, Pain, Strength, ROM, UE functional use, Flexibility       Visit Diagnosis: Stiffness of left  wrist, not elsewhere classified  Pain in left wrist  Muscle weakness (generalized)    Problem List Patient Active Problem List   Diagnosis Date Noted   History of colonic polyps 06/10/2022   Polyp of descending colon 06/10/2022   Palpitations 09/08/2019   Urinary incontinence 06/28/2016   Preventative health care 06/28/2016   Insomnia 10/26/2015   Depression 04/28/2015   Attention deficit hyperactivity disorder 04/28/2015    Oletta Cohn, OTR/L,CLT 03/06/2023, 10:23 AM  [facility] [address], [place], [address] Phone: [phone]   Fax:  [phone]  Name: Karen Prince MRN: [id number] Date of Birth: May 01, 1956

## 2023-03-06 NOTE — Therapy (Deleted)
OUTPATIENT OCCUPATIONAL THERAPY BREAST CANCER BASELINE EVALUATION   Patient Name: Karen Prince MRN: [id number] DOB:04-May-1956, 67 y.o., female Today's Date: 03/06/2023  END OF SESSION: @OTENDOFSESSION @  Past Medical History:  Diagnosis Date   ADHD (attention deficit hyperactivity disorder)    Anemia    Chicken pox    Depression    Essential tremor    Hearing loss    follows with audologist   IBS (irritable bowel syndrome)    Insomnia    Leucopenia    has blood draw every 5 years   Measles    Mitral valve prolapse    Palpitations    PVC (premature ventricular contraction)    Urinary incontinence    Past Surgical History:  Procedure Laterality Date   COLONOSCOPY WITH PROPOFOL N/A 01/04/2016   Procedure: COLONOSCOPY WITH PROPOFOL;  Surgeon: Scot Jun, MD;  Location: [facility] ENDOSCOPY;  Service: Endoscopy;  Laterality: N/A;   COLONOSCOPY WITH PROPOFOL N/A 06/10/2022   Procedure: COLONOSCOPY WITH PROPOFOL;  Surgeon: Toney Reil, MD;  Location: [facility] ENDOSCOPY;  Service: Gastroenterology;  Laterality: N/A;   INGUINAL HERNIA REPAIR Right 1984   surgery performed to remove endometrial mass and hernai was found secondary   KNEE ARTHROSCOPY Right 1993   Patient Active Problem List   Diagnosis Date Noted   History of colonic polyps 06/10/2022   Polyp of descending colon 06/10/2022   Palpitations 09/08/2019   Urinary incontinence 06/28/2016   Preventative health care 06/28/2016   Insomnia 10/26/2015   Depression 04/28/2015   Attention deficit hyperactivity disorder 04/28/2015    PCP: ***  REFERRING PROVIDER: ***  REFERRING DIAG: ***  THERAPY DIAG:  No diagnosis found.  Rationale for Evaluation and Treatment: {HABREHAB:27488}  ONSET DATE: ***  SUBJECTIVE:                                                                                                                                                                                           SUBJECTIVE  STATEMENT: Patient reports she is here today after being refer by one of her medical team for her newly diagnosed {RIGHT/LEFT:21944} breast cancer.   PERTINENT HISTORY:  Patient was diagnosed on *** with {RIGHT/LEFT:21944} grade ***. It measures *** cm and is located in the *** quadrant. It is *** with a Ki67 of ***%.   PATIENT GOALS:   reduce lymphedema risk and learn post op HEP.   PAIN:  Are you having pain? {OPRCPAIN:27236}  PRECAUTIONS: Active CA {Therapy precautions:24002}  RED FLAGS: {PT Red Flags:29287}   HAND DOMINANCE: {RIGHT/LEFT:21944}  WEIGHT BEARING RESTRICTIONS: {Yes ***/No:24003}  FALLS:  Has patient fallen in last 6 months? {  fallsyesno:27318}  LIVING ENVIRONMENT: Patient lives with: *** Lives in: {Lives in:25570} Has following equipment at home: {Assistive devices:23999}  OCCUPATION: ***  LEISURE: ***  PRIOR LEVEL OF FUNCTION: {PLOF:24004}   OBJECTIVE:  COGNITION: Overall cognitive status: {cognition:24006}    POSTURE:  Forward head and rounded shoulders posture  UPPER EXTREMITY AROM/PROM:  A/PROM RIGHT   eval   Shoulder extension   Shoulder flexion   Shoulder abduction   Shoulder internal rotation   Shoulder external rotation     (Blank rows = not tested)  A/PROM LEFT   eval  Shoulder extension   Shoulder flexion   Shoulder abduction   Shoulder internal rotation   Shoulder external rotation     (Blank rows = not tested)  CERVICAL AROM: All within normal limits:    Percent limited  Flexion   Extension   Right lateral flexion   Left lateral flexion   Right rotation   Left rotation     UPPER EXTREMITY STRENGTH: ***  LYMPHEDEMA ASSESSMENTS:   LANDMARK RIGHT   eval  10 cm proximal to olecranon process   Olecranon process   10 cm proximal to ulnar styloid process   Just proximal to ulnar styloid process   Across hand at thumb web space   At base of 2nd digit   (Blank rows = not tested)  LANDMARK LEFT   eval  10 cm  proximal to olecranon process   Olecranon process   10 cm proximal to ulnar styloid process   Just proximal to ulnar styloid process   Across hand at thumb web space   At base of 2nd digit   (Blank rows = not tested)  L-DEX LYMPHEDEMA SCREENING:  The patient was assessed using the L-Dex machine today to produce a lymphedema index baseline score. The patient will be reassessed on a regular basis (typically every 3 months) to obtain new L-Dex scores. If the score is > 6.5 points away from his/her baseline score indicating onset of subclinical lymphedema, it will be recommended to wear a compression garment for 4 weeks, 12 hours per day and then be reassessed. If the score continues to be > 6.5 points from baseline at reassessment, we will initiate lymphedema treatment. Assessing in this manner has a 95% rate of preventing clinically significant lymphedema.    QUICK DASH SURVEY: ***  PATIENT EDUCATION:  Education details: Lymphedema risk reduction and post op shoulder/posture HEP Person educated: Patient Education method: Explanation, Demonstration, Handout Education comprehension: Patient verbalized understanding and returned demonstration  HOME EXERCISE PROGRAM: Patient was instructed today in a home exercise program today for post op shoulder range of motion. These included active assist shoulder flexion in sitting, scapular retraction, wall walking with shoulder abduction, and hands behind head external rotation.  She was encouraged to do these twice a day, holding 3 seconds and repeating 5 times when permitted by her physician.   ASSESSMENT:  CLINICAL IMPRESSION: ***Her multidisciplinary medical team has met to assess and determine a recommended treatment plan. She is planning to have ***. She will benefit from a post op OT reassessment to determine needs and from L-Dex screens every 3 months for 2 years to detect subclinical lymphedema.  Pt will benefit from skilled therapeutic  intervention to improve on the following deficits: Decreased knowledge of precautions, impaired UE functional use, pain, decreased ROM, postural dysfunction.   OT treatment/interventions: ADL/self-care home management, pt/family education, therapeutic exercise  REHAB POTENTIAL: {rehabpotential:25112}  CLINICAL DECISION MAKING: {clinical decision making:25114}  EVALUATION  COMPLEXITY: {Evaluation complexity:25115}   GOALS: Goals reviewed with patient? YES  LONG TERM GOALS: (STG=LTG)    Name Target Date Goal status  1 Pt will be able to verbalize understanding of pertinent lymphedema risk reduction practices relevant to her dx specifically related to skin care.  Baseline:  No knowledge 03/06/2023 Achieved at eval  2 Pt will be able to return demo and/or verbalize understanding of the post op HEP related to regaining shoulder ROM. Baseline:  No knowledge 03/06/2023 Achieved at eval  3 Pt will be able to verbalize understanding of the importance of attending the post op After Breast CA Class for further lymphedema risk reduction education and therapeutic exercise.  Baseline:  No knowledge 03/06/2023 Achieved at eval  4 Pt will demo she has regained full shoulder ROM and function post operatively compared to baselines.  Baseline: See objective measurements taken today. ***     PLAN:  OT FREQUENCY/DURATION: EVAL and 1 follow up appointment.   PLAN FOR NEXT SESSION: will reassess 3-4 weeks post op to determine needs.   Patient will follow up at outpatient cancer rehab 3-4 weeks following surgery.  If the patient requires occupational therapy at that time, a specific plan will be dictated and sent to the referring physician for approval. The patient was educated today on appropriate basic range of motion exercises to begin post operatively and the importance of attending the After Breast Cancer class following surgery.  Patient was educated today on lymphedema risk reduction practices as it  pertains to recommendations that will benefit the patient immediately following surgery.  She verbalized good understanding.    Occupational Therapy Information for After Breast Cancer Surgery/Treatment:  Lymphedema is a swelling condition that you may be at risk for in your arm if you have lymph nodes removed from the armpit area.  After a sentinel node biopsy, the risk is approximately 5-9% and is higher after an axillary node dissection.  There is treatment available for this condition and it is not life-threatening.  Contact your physician or occupational therapist with concerns. You may begin the 4 shoulder/posture exercises (see additional sheet) when permitted by your physician (typically a week after surgery).  If you have drains, you may need to wait until those are removed before beginning range of motion exercises.  A general recommendation is to not lift your arms above shoulder height until drains are removed.  These exercises should be done to your tolerance and gently.  This is not a "no pain/no gain" type of recovery so listen to your body and stretch into the range of motion that you can tolerate, stopping if you have pain.  If you are having immediate reconstruction, ask your plastic surgeon about doing exercises as he or she may want you to wait. We encourage you to attend the free one time ABC (After Breast Cancer) class offered by [facility].  You will learn information related to lymphedema risk, prevention and treatment and additional exercises to regain mobility following surgery.  You can call [phone] for more information.  This is offered the 1st and 3rd Monday of each month.  You only attend the class one time. While undergoing any medical procedure or treatment, try to avoid blood pressure being taken or needle sticks from occurring on the arm on the side of cancer.   This recommendation begins after surgery and continues for the rest of your life.   This may help reduce your risk of getting lymphedema (swelling  in your arm). An excellent resource for those seeking information on lymphedema is the National Lymphedema Network's web site. It can be accessed at www.lymphnet.org If you notice swelling in your hand, arm or breast at any time following surgery (even if it is many years from now), please contact your doctor or occupational therapist to discuss this.  Lymphedema can be treated at any time but it is easier for you if it is treated early on.  If you feel like your shoulder motion is not returning to normal in a reasonable amount of time, please contact your surgeon or occupational therapist.  [facility] [phone]. [address] [place], [place] [address]  ABC CLASS After Breast Cancer Class  After Breast Cancer Class is a specially designed exercise class to assist you in a safe recover after having breast cancer surgery.  In this class you will learn how to get back to full function whether your drains were just removed or if you had surgery a month ago.  This one-time class is held the 1st and 3rd Monday of every month from 11:00 a.m. until 12:00 noon virtually.  This class is FREE and space is limited. For more information or to register for the next available class, call [phone].  Class Goals  Understand specific stretches to improve the flexibility of you chest and shoulder. Learn ways to safely strengthen your upper body and improve your posture. Understand the warning signs of infection and why you may be at risk for an arm infection. Learn about Lymphedema and prevention.  ** You do not attend this class until after surgery.  Drains must be removed to participate  Patient was instructed today in a home exercise program today for post op shoulder range of motion. These included active assist shoulder flexion in sitting, scapular retraction, wall walking with shoulder abduction, and  hands behind head external rotation.  She was encouraged to do these twice a day, holding 3 seconds and repeating 5 times when permitted by her physician.    Oletta Cohn, OT 03/06/2023, 8:53 AM

## 2023-03-13 ENCOUNTER — Ambulatory Visit: Payer: Medicare Other | Admitting: Occupational Therapy

## 2023-03-13 DIAGNOSIS — M6281 Muscle weakness (generalized): Secondary | ICD-10-CM | POA: Diagnosis not present

## 2023-03-13 DIAGNOSIS — M25532 Pain in left wrist: Secondary | ICD-10-CM

## 2023-03-13 DIAGNOSIS — M25632 Stiffness of left wrist, not elsewhere classified: Secondary | ICD-10-CM | POA: Diagnosis not present

## 2023-03-13 NOTE — Therapy (Signed)
[facility] [address] [place], [place], [address] Phone: [phone]   Fax:  [phone]  Occupational Therapy Treatment  Patient Details  Name: Karen Prince MRN: [id number] Date of Birth: 01-05-1956 Referring Provider (OT): Kalida PA   Encounter Date: 03/13/2023   OT End of Session - 03/13/23 1329     Visit Number 4    Number of Visits 10    Date for OT Re-Evaluation 03/31/23    OT Start Time 1120    OT Stop Time 1200    OT Time Calculation (min) 40 min    Activity Tolerance Patient tolerated treatment well    Behavior During Therapy [facility] for tasks assessed/performed             Past Medical History:  Diagnosis Date   ADHD (attention deficit hyperactivity disorder)    Anemia    Chicken pox    Depression    Essential tremor    Hearing loss    follows with audologist   IBS (irritable bowel syndrome)    Insomnia    Leucopenia    has blood draw every 5 years   Measles    Mitral valve prolapse    Palpitations    PVC (premature ventricular contraction)    Urinary incontinence     Past Surgical History:  Procedure Laterality Date   COLONOSCOPY WITH PROPOFOL N/A 01/04/2016   Procedure: COLONOSCOPY WITH PROPOFOL;  Surgeon: Scot Jun, MD;  Location: [facility] ENDOSCOPY;  Service: Endoscopy;  Laterality: N/A;   COLONOSCOPY WITH PROPOFOL N/A 06/10/2022   Procedure: COLONOSCOPY WITH PROPOFOL;  Surgeon: Toney Reil, MD;  Location: [facility] ENDOSCOPY;  Service: Gastroenterology;  Laterality: N/A;   INGUINAL HERNIA REPAIR Right 1984   surgery performed to remove endometrial mass and hernai was found secondary   KNEE ARTHROSCOPY Right 1993    There were no vitals filed for this visit.   Subjective Assessment - 03/13/23 1326     Subjective  My motion got really good.  In the morning it is stiff but by the end of the day compared to the R - it is about the same.  Rotation still the end range  2/10 pain.  Using it a lot.    Pertinent History 02/10/23 Ortho note- Mckaley Prince is a 67 y.o. female who presents today for cast removal and repeat x-rays of her left wrist. The patient suffered a displaced left distal radius fracture and did undergo a closed reduction at the emergency room. Initial date of injury is 12/22/2022. The patient was kept in a sugar-tong splint initially, she was evaluated on 12/30/2022 where she was placed into a long-arm cast with the wrist in a flexed position. She was last evaluated on 01/20/2023. At this appointment the patient was transition from a long-arm cast into a short arm cast. She presents today for cast removal and repeat x-rays of the right wrist. The patient reports a 0 out of 10 pain score at today's appointment. She denies any repeat trauma or injury affecting the left upper extremity since her last evaluation- cast removed and put in prefab wrist splint    Patient Stated Goals Want my motion and strength back to work in yard, around the house - turn my palm up , carry and lift objects without pain    Currently in Pain? Yes    Pain Score 2     Pain Location Wrist    Pain Orientation Left  Pain Descriptors / Indicators Aching    Pain Type Acute pain    Pain Frequency Occasional                [facility] OT Assessment - 03/13/23 0001       AROM   Left Forearm Supination 85 Degrees    Left Wrist Extension 70 Degrees    Left Wrist Flexion 85 Degrees    Left Wrist Radial Deviation 25 Degrees    Left Wrist Ulnar Deviation 20 Degrees      Strength   Right Hand Grip (lbs) 62    Right Hand Lateral Pinch 14 lbs    Right Hand 3 Point Pinch 14 lbs    Left Hand Grip (lbs) 42    Left Hand Lateral Pinch 10 lbs    Left Hand 3 Point Pinch 10 lbs             Patient report increased motion endrange by the end of the day.  Do have some stiffness in the morning.  Patient to have some OA of the thumb CMC's. Grip and prehension about the same than last time. Done  paraffin this date for moist heat to decrease stiffness.         OT Treatments/Exercises (OP) - 03/13/23 0001       LUE Paraffin   Number Minutes Paraffin 8 Minutes    LUE Paraffin Location Wrist;Hand    Comments decrease stiffness - end range and thumb stiffness prior to ROM            Soft tissue massage doing metacarpal spreads as well as webspace Followed by active assisted range of motion by OT for endrange supination, extension as well as flexion open and close fist.  Reinforced with patient to focus also on flexion close hand and loose fist.    Patient to continue to focus also on the radial ulnar deviation active assisted range of motion. Continue to do PROM table slides over towel with CMC relax - 20reps pain free - slight pull  Followed by wall pushup -patient did feel a slight pull on the ulnar side of wrist.  Continue at home -after table slides. Patient to continue with 2 lbs weight  Sup/pro, flexion, ext and RD, UD  2-3 sets of 10-12 reps pain-free After which she can switch to red Thera-Band reviewed with the patient.  Continue med firm green putty for grip 2-3 sets of 12 reps 2 times a day As patient increases functional use patient can decrease strengthening exercises to 1 time a day.   Patient to continue at home for about 2 to 3 weeks and follow-up with a possible discharge.           OT Education - 03/13/23 1329     Education Details progress and HEP changes    Person(s) Educated Patient    Methods Explanation;Demonstration;Tactile cues;Verbal cues;Handout    Comprehension Verbal cues required;Returned demonstration;Verbalized understanding                 OT Long Term Goals - 03/13/23 1333       OT LONG TERM GOAL #1   Title Pt to be ind in HEP to increase L wrist and forearm AROM to [facility] to carry objects on palm and forearm, wash back without increase symptoms.    Baseline sup 85, flexion 85 ext 70 - pain only with last 5-10 degrees of  sup    Time 1    Period Weeks  Status On-going    Target Date 03/17/23      OT LONG TERM GOAL #2   Title L wrist AROM and strenght WNL for pt to turn doorknob , push and pull heavy door without increase symptoms or issues    Baseline 7 wks s/p decrease AROM sup 65, flexion, ext 50 , RD 18 and UD 20- pain with use past motion - no strengthening NOW increase strength in wrist and forearm - pain end range sup - 85 sup, flexion 85 and ext 70 - still some discomfort with wall pusup    Time 3    Period Weeks    Status On-going    Target Date 03/31/23      OT LONG TERM GOAL #3   Title L grip and prehension strenght increase to more than 60% compare to R to carry groceries more than 8 lbs, hold plate, cut food without increase symptoms    Baseline Grip R 65, L 26lbs, Lat pinch R 15, L 10 and 3 point R 14, L 8 lbs- pain when lifting , carrying - NOW grip 65, L 45, lat 15 and 10 lbs -and 3 point 14 and 10 lbs -    Status Achieved                   Plan - 03/13/23 1329     Clinical Impression Statement Pt present at OT evaluation with diagnosis of L distal radius fx with reduction in ED on 12/22/22. Pt 11  wks out from injury-  Pt arrive without splints. Cont to make great progress  in AROM for L wrist and forearm in all planes -  grip and prehension strenght this am about same as last time- but pt do report stiffness in the am and decrease as the day goes with also increase motion. Pt can cont with 2 lbs for wrist and forearm and then switch to theraband. 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 Pt can cont with HEP for end range of motion, strengthening and increase functional use - follow up in 3 wks if needed and possible discharge.    OT Occupational Profile and History Problem Focused Assessment - Including review of records relating to presenting problem    Occupational performance deficits (Please refer to evaluation for details):  ADL's;IADL's;Play;Social Participation;Leisure    Body Structure / Function / Physical Skills ADL;IADL;Pain;Strength;ROM;UE functional use;Flexibility    Rehab Potential Good    Clinical Decision Making Limited treatment options, no task modification necessary    Comorbidities Affecting Occupational Performance: None    Modification or Assistance to Complete Evaluation  No modification of tasks or assist necessary to complete eval    OT Frequency Biweekly    OT Duration 4 weeks    OT Treatment/Interventions Self-care/ADL training;Contrast Bath;DME and/or AE instruction;Manual Therapy;Passive range of motion;Fluidtherapy;Paraffin;Splinting;Patient/family education;Therapeutic exercise    Consulted and Agree with Plan of Care Patient             Patient will benefit from skilled therapeutic intervention in order to improve the following deficits and impairments:   Body Structure / Function / Physical Skills: ADL, IADL, Pain, Strength, ROM, UE functional use, Flexibility       Visit Diagnosis: Stiffness of left wrist, not elsewhere classified  Pain in left wrist  Muscle weakness (generalized)    Problem List Patient Active Problem List   Diagnosis Date Noted   History of colonic  polyps 06/10/2022   Polyp of descending colon 06/10/2022   Palpitations 09/08/2019   Urinary incontinence 06/28/2016   Preventative health care 06/28/2016   Insomnia 10/26/2015   Depression 04/28/2015   Attention deficit hyperactivity disorder 04/28/2015    Oletta Cohn, OTR/L,CLT 03/13/2023, 1:36 PM  [facility] [address], [place], [address] Phone: [phone]   Fax:  [phone]  Name: Keirston Covington MRN: [id number] Date of Birth: 06-09-1956

## 2023-03-14 DIAGNOSIS — S52572D Other intraarticular fracture of lower end of left radius, subsequent encounter for closed fracture with routine healing: Secondary | ICD-10-CM | POA: Diagnosis not present

## 2023-03-19 ENCOUNTER — Ambulatory Visit (INDEPENDENT_AMBULATORY_CARE_PROVIDER_SITE_OTHER): Payer: Medicare Other | Admitting: Primary Care

## 2023-03-19 ENCOUNTER — Other Ambulatory Visit: Payer: Self-pay | Admitting: Primary Care

## 2023-03-19 ENCOUNTER — Encounter: Payer: Self-pay | Admitting: Primary Care

## 2023-03-19 VITALS — BP 120/60 | HR 88 | Temp 97.2°F | Ht 69.5 in | Wt 194.0 lb

## 2023-03-19 DIAGNOSIS — F3341 Major depressive disorder, recurrent, in partial remission: Secondary | ICD-10-CM

## 2023-03-19 DIAGNOSIS — F909 Attention-deficit hyperactivity disorder, unspecified type: Secondary | ICD-10-CM | POA: Diagnosis not present

## 2023-03-19 DIAGNOSIS — R7303 Prediabetes: Secondary | ICD-10-CM

## 2023-03-19 DIAGNOSIS — G4700 Insomnia, unspecified: Secondary | ICD-10-CM | POA: Diagnosis not present

## 2023-03-19 DIAGNOSIS — E785 Hyperlipidemia, unspecified: Secondary | ICD-10-CM | POA: Diagnosis not present

## 2023-03-19 DIAGNOSIS — Z0000 Encounter for general adult medical examination without abnormal findings: Secondary | ICD-10-CM

## 2023-03-19 HISTORY — DX: Hyperlipidemia, unspecified: E78.5

## 2023-03-19 LAB — BASIC METABOLIC PANEL
BUN: 10 mg/dL (ref 6–23)
CO2: 26 mEq/L (ref 19–32)
Calcium: 9.6 mg/dL (ref 8.4–10.5)
Chloride: 104 mEq/L (ref 96–112)
Creatinine, Ser: 0.75 mg/dL (ref 0.40–1.20)
GFR: 81.77 mL/min (ref >60.00)
Glucose, Bld: 105 mg/dL — ABNORMAL HIGH (ref 70–99)
Potassium: 4.5 mEq/L (ref 3.5–5.1)
Sodium: 138 mEq/L (ref 135–145)

## 2023-03-19 LAB — HEMOGLOBIN A1C: Hgb A1c MFr Bld: 5.7 % (ref 4.6–6.5)

## 2023-03-19 MED ORDER — ZALEPLON 10 MG PO CAPS
10.0000 mg | ORAL_CAPSULE | Freq: Every evening | ORAL | 0 refills | Status: DC | PRN
Start: 2023-03-19 — End: 2023-08-29

## 2023-03-19 NOTE — Assessment & Plan Note (Addendum)
Historically controlled, deteriorated recently.  Refill Sonata 10 mg HS PRN for which she uses sparingly.

## 2023-03-19 NOTE — Assessment & Plan Note (Signed)
Immunizations UTD. Mammogram and bone density scan UTD Colonoscopy UTD, due 2031  Discussed the importance of a healthy diet and regular exercise in order for weight loss, and to reduce the risk of further co-morbidity.  Exam stable. Labs pending.  Follow up in 1 year for repeat physical.

## 2023-03-19 NOTE — Assessment & Plan Note (Addendum)
Controlled.  Continue bupropion SR 150 mg daily.

## 2023-03-19 NOTE — Progress Notes (Signed)
Subjective:    Patient ID: Karen Prince, female    DOB: 1955-11-20, 67 y.o.   MRN: [id number]  HPI  Karen Prince is a very pleasant 67 y.o. female who presents today for complete physical and follow up of chronic conditions.   Immunizations: -Tetanus: Completed in 2023 -Shingles: Completed Shingrix series -Pneumonia: Completed Prevnar 20 in 2023  Diet: Fair diet.  Exercise: No regular exercise.  Eye exam: Completes annually  Dental exam: Completes semi-annually    Mammogram: Completed last in December 2023 Bone Density Scan: Completed last in December 2023  Colonoscopy: Completed in 2023, due 2033  BP Readings from Last 3 Encounters:  03/19/23 120/60  12/22/22 134/82  06/10/22 138/75       Review of Systems  Constitutional:  Negative for unexpected weight change.  HENT:  Negative for rhinorrhea.   Respiratory:  Negative for cough and shortness of breath.   Cardiovascular:  Negative for chest pain.  Gastrointestinal:  Negative for constipation and diarrhea.  Genitourinary:  Negative for difficulty urinating.  Musculoskeletal:  Negative for arthralgias and myalgias.  Skin:  Negative for rash.  Allergic/Immunologic: Negative for environmental allergies.  Neurological:  Negative for dizziness and headaches.  Psychiatric/Behavioral:  Positive for sleep disturbance. The patient is not nervous/anxious.          Past Medical History:  Diagnosis Date   ADHD (attention deficit hyperactivity disorder)    Anemia    Anxiety    Arthritis    Cataract    Chicken pox    Depression    Essential tremor    Hearing loss    follows with audologist   Heart murmur    Hyperlipidemia 03/19/2023   IBS (irritable bowel syndrome)    Insomnia    Leucopenia    has blood draw every 5 years   Measles    Mitral valve prolapse    Palpitations    PVC (premature ventricular contraction)    Urinary incontinence     Social History   Socioeconomic History   Marital  status: Media planner    Spouse name: Not on file   Number of children: Not on file   Years of education: Not on file   Highest education level: Doctorate  Occupational History   Not on file  Tobacco Use   Smoking status: Never   Smokeless tobacco: Never  Vaping Use   Vaping status: Never Used  Substance and Sexual Activity   Alcohol use: Yes    Alcohol/week: 4.0 standard drinks of alcohol    Types: 2 Glasses of wine, 2 Standard drinks or equivalent per week    Comment: occ   Drug use: No   Sexual activity: Yes    Birth control/protection: Post-menopausal  Other Topics Concern   Not on file  Social History Narrative   Single, has a partner.   Works as a Professor of Physical Therapy.   Works at [employer].   Enjoys photography, travel, being outdoors, relaxing.   Social Determinants of Health   Financial Resource Strain: Low Risk  (03/17/2023)   Overall Financial Resource Strain (CARDIA)    Difficulty of Paying Living Expenses: Not hard at all  Food Insecurity: No Food Insecurity (03/17/2023)   Hunger Vital Sign    Worried About Running Out of Food in the Last Year: Never true    Ran Out of Food in the Last Year: Never true  Transportation Needs: No Transportation Needs (03/17/2023)   PRAPARE - Transportation  Lack of Transportation (Medical): No    Lack of Transportation (Non-Medical): No  Physical Activity: Insufficiently Active (03/17/2023)   Exercise Vital Sign    Days of Exercise per Week: 7 days    Minutes of Exercise per Session: 20 min  Stress: Stress Concern Present (03/17/2023)   [employer] of Occupational Health - Occupational Stress Questionnaire    Feeling of Stress : To some extent  Social Connections: Unknown (03/17/2023)   Social Connection and Isolation Panel [NHANES]    Frequency of Communication with Friends and Family: Patient declined    Frequency of Social Gatherings with Friends and Family: Patient declined    Attends Religious Services:  Never    Database administrator or Organizations: No    Attends Banker Meetings: Never    Marital Status: Living with partner  Intimate Partner Violence: Not At Risk (05/22/2022)   Humiliation, Afraid, Rape, and Kick questionnaire    Fear of Current or Ex-Partner: No    Emotionally Abused: No    Physically Abused: No    Sexually Abused: No    Past Surgical History:  Procedure Laterality Date   COLONOSCOPY WITH PROPOFOL N/A 01/04/2016   Procedure: COLONOSCOPY WITH PROPOFOL;  Surgeon: Scot Jun, MD;  Location: [facility] ENDOSCOPY;  Service: Endoscopy;  Laterality: N/A;   COLONOSCOPY WITH PROPOFOL N/A 06/10/2022   Procedure: COLONOSCOPY WITH PROPOFOL;  Surgeon: Toney Reil, MD;  Location: [facility] ENDOSCOPY;  Service: Gastroenterology;  Laterality: N/A;   INGUINAL HERNIA REPAIR Right 1984   surgery performed to remove endometrial mass and hernai was found secondary   KNEE ARTHROSCOPY Right 1993    Family History  Problem Relation Age of Onset   Hypertension Father    Macular degeneration Father    Heart disease Father    Emphysema Father    Vision loss Father    Melanoma Mother 75   Anuerysm Mother        Abdominal   Emphysema Mother    Miscarriages / [place] Mother    Vision loss Mother    Breast cancer Paternal Grandmother        80s   Cervical cancer Maternal Grandmother    Ovarian cancer Cousin    Heart disease Brother     No Known Allergies  Current Outpatient Medications on File Prior to Visit  Medication Sig Dispense Refill   atorvastatin (LIPITOR) 20 MG tablet TAKE 1 TABLET BY MOUTH DAILY FOR CHOLESTEROL 90 tablet 1   buPROPion (WELLBUTRIN SR) 150 MG 12 hr tablet TAKE 1 TABLET BY MOUTH DAILY FOR DEPRESSION 90 tablet 0   CALCIUM PO Take 1,200 mg by mouth daily.     Cholecalciferol (VITAMIN D3) 2000 UNITS capsule Take 2,000 Units by mouth every other day.      DULoxetine (CYMBALTA) 60 MG capsule Take 1 capsule (60 mg total) by mouth daily.  For depression and anxiety. 90 capsule 3   Melatonin 3 MG TABS Take 3 mg by mouth.     Omega-3 Fatty Acids (FISH OIL) 1000 MG CAPS Take by mouth daily.     No current facility-administered medications on file prior to visit.    BP 120/60   Pulse 88   Temp (!) 97.2 F (36.2 C) (Temporal)   Ht 5' 9.5" (1.765 m)   Wt 194 lb (88 kg)   SpO2 98%   BMI 28.24 kg/m  Objective:   Physical Exam HENT:     Right Ear: Tympanic membrane and ear  canal normal.     Left Ear: Tympanic membrane and ear canal normal.     Nose: Nose normal.  Eyes:     Conjunctiva/sclera: Conjunctivae normal.     Pupils: Pupils are equal, round, and reactive to light.  Neck:     Thyroid: No thyromegaly.  Cardiovascular:     Rate and Rhythm: Normal rate and regular rhythm.     Heart sounds: No murmur heard. Pulmonary:     Effort: Pulmonary effort is normal.     Breath sounds: Normal breath sounds. No rales.  Abdominal:     General: Bowel sounds are normal.     Palpations: Abdomen is soft.     Tenderness: There is no abdominal tenderness.  Musculoskeletal:        General: Normal range of motion.     Cervical back: Neck supple.  Lymphadenopathy:     Cervical: No cervical adenopathy.  Skin:    General: Skin is warm and dry.     Findings: No rash.  Neurological:     Mental Status: She is alert and oriented to person, place, and time.     Cranial Nerves: No cranial nerve deficit.     Deep Tendon Reflexes: Reflexes are normal and symmetric.  Psychiatric:        Mood and Affect: Mood normal.           Assessment & Plan:  Preventative health care Assessment & Plan: Immunizations UTD. Mammogram and bone density scan UTD Colonoscopy UTD, due 2031  Discussed the importance of a healthy diet and regular exercise in order for weight loss, and to reduce the risk of further co-morbidity.  Exam stable. Labs pending.  Follow up in 1 year for repeat physical.    Attention deficit hyperactivity  disorder (ADHD), unspecified ADHD type Assessment & Plan: Controlled.  Continue bupropion SR 150 mg daily.   Recurrent major depressive disorder, in partial remission (HCC) Assessment & Plan: Controlled.  Continue bupropion SR 150 mg daily, duloxetine 60 mg daily.   Insomnia, unspecified type Assessment & Plan: Historically controlled, deteriorated recently.  Refill Sonata 10 mg HS PRN for which she uses sparingly.   Orders: -     Zaleplon; Take 1 capsule (10 mg total) by mouth at bedtime as needed for sleep.  Dispense: 30 capsule; Refill: 0  Hyperlipidemia, unspecified hyperlipidemia type Assessment & Plan: Controlled.  Continue atorvastatin 20 mg daily. Reviewed labs from April 2024  Orders: -     Basic metabolic panel -     Hemoglobin A1c        Doreene Nest, NP

## 2023-03-19 NOTE — Assessment & Plan Note (Signed)
Controlled.  Continue atorvastatin 20 mg daily. Reviewed labs from April 2024

## 2023-03-19 NOTE — Patient Instructions (Signed)
Stop by the lab prior to leaving today. I will notify you of your results once received.   It was a pleasure to see you today!  

## 2023-03-19 NOTE — Assessment & Plan Note (Addendum)
Controlled.  Continue bupropion SR 150 mg daily, duloxetine 60 mg daily.

## 2023-03-21 ENCOUNTER — Encounter: Payer: Medicare Other | Admitting: Primary Care

## 2023-03-25 DIAGNOSIS — K080 Exfoliation of teeth due to systemic causes: Secondary | ICD-10-CM | POA: Diagnosis not present

## 2023-04-03 ENCOUNTER — Ambulatory Visit: Payer: Medicare Other | Attending: Student | Admitting: Occupational Therapy

## 2023-04-03 DIAGNOSIS — M25532 Pain in left wrist: Secondary | ICD-10-CM | POA: Diagnosis not present

## 2023-04-03 DIAGNOSIS — M6281 Muscle weakness (generalized): Secondary | ICD-10-CM | POA: Insufficient documentation

## 2023-04-03 DIAGNOSIS — M25632 Stiffness of left wrist, not elsewhere classified: Secondary | ICD-10-CM | POA: Diagnosis not present

## 2023-04-03 NOTE — Therapy (Signed)
[facility] [address] [place], [place], [address] Phone: [phone]   Fax:  [phone]  Occupational Therapy Treatment/discharge  Patient Details  Name: Karen Prince MRN: [id number] Date of Birth: 1956/05/14 Referring Provider (OT): [place] PA   Encounter Date: 04/03/2023   OT End of Session - 04/03/23 1610     Visit Number 5    Number of Visits 5    Date for OT Re-Evaluation 04/03/23    OT Start Time 1530    OT Stop Time 1555    OT Time Calculation (min) 25 min    Activity Tolerance Patient tolerated treatment well    Behavior During Therapy [facility] for tasks assessed/performed             Past Medical History:  Diagnosis Date   ADHD (attention deficit hyperactivity disorder)    Anemia    Anxiety    Arthritis    Cataract    Chicken pox    Depression    Essential tremor    Hearing loss    follows with audologist   Heart murmur    Hyperlipidemia 03/19/2023   IBS (irritable bowel syndrome)    Insomnia    Leucopenia    has blood draw every 5 years   Measles    Mitral valve prolapse    Palpitations    PVC (premature ventricular contraction)    Urinary incontinence     Past Surgical History:  Procedure Laterality Date   COLONOSCOPY WITH PROPOFOL N/A 01/04/2016   Procedure: COLONOSCOPY WITH PROPOFOL;  Surgeon: Scot Jun, MD;  Location: [facility] ENDOSCOPY;  Service: Endoscopy;  Laterality: N/A;   COLONOSCOPY WITH PROPOFOL N/A 06/10/2022   Procedure: COLONOSCOPY WITH PROPOFOL;  Surgeon: Toney Reil, MD;  Location: [facility] ENDOSCOPY;  Service: Gastroenterology;  Laterality: N/A;   INGUINAL HERNIA REPAIR Right 1984   surgery performed to remove endometrial mass and hernai was found secondary   KNEE ARTHROSCOPY Right 1993    There were no vitals filed for this visit.   Subjective Assessment - 04/03/23 1609     Subjective  My wrist and hand done good until the last day or 2 little more stiff- or  tight for that last motion- more strength and using it more    Pertinent History 02/10/23 Ortho note- Karen Prince is a 67 y.o. female who presents today for cast removal and repeat x-rays of her left wrist. The patient suffered a displaced left distal radius fracture and did undergo a closed reduction at the emergency room. Initial date of injury is 12/22/2022. The patient was kept in a sugar-tong splint initially, she was evaluated on 12/30/2022 where she was placed into a long-arm cast with the wrist in a flexed position. She was last evaluated on 01/20/2023. At this appointment the patient was transition from a long-arm cast into a short arm cast. She presents today for cast removal and repeat x-rays of the right wrist. The patient reports a 0 out of 10 pain score at today's appointment. She denies any repeat trauma or injury affecting the left upper extremity since her last evaluation- cast removed and put in prefab wrist splint    Patient Stated Goals Want my motion and strength back to work in yard, around the house - turn my palm up , carry and lift objects without pain    Currently in Pain? No/denies  [facility] OT Assessment - 04/03/23 0001       AROM   Left Forearm Pronation 90 Degrees    Left Forearm Supination 90 Degrees    Left Wrist Extension 70 Degrees    Left Wrist Flexion 85 Degrees    Left Wrist Radial Deviation 25 Degrees    Left Wrist Ulnar Deviation 30 Degrees      Strength   Right Hand Grip (lbs) 62    Right Hand Lateral Pinch 14 lbs    Right Hand 3 Point Pinch 14 lbs    Left Hand Grip (lbs) 40    Left Hand Lateral Pinch 11 lbs    Left Hand 3 Point Pinch 11 lbs             assess AROM in L wrist and strength in wrist and sup/pro 5/5 Great progress from [facility] in motion and strength in L wrist and hand Continue to do PROM table slides over towel with CMC relax - 20reps pain free - slight pull  Followed by wall pushup   Continue at home -after table  slides.   Upgrade to firm dark blue putty  grip 15 reps  2 times a day  As well as pulling with ulnar side of hand 15 reps Pain free                   OT Education - 04/03/23 1610     Education Details progress and HEP changes    Person(s) Educated Patient    Methods Explanation;Demonstration;Tactile cues;Verbal cues;Handout    Comprehension Verbal cues required;Returned demonstration;Verbalized understanding                 OT Long Term Goals - 04/03/23 1614       OT LONG TERM GOAL #1   Title Pt to be ind in HEP to increase L wrist and forearm AROM to [facility] to carry objects on palm and forearm, wash back without increase symptoms.    Status Achieved      OT LONG TERM GOAL #2   Title L wrist AROM and strenght WNL for pt to turn doorknob , push and pull heavy door without increase symptoms or issues    Status Achieved      OT LONG TERM GOAL #3   Title L grip and prehension strenght increase to more than 60% compare to R to carry groceries more than 8 lbs, hold plate, cut food without increase symptoms    Status Achieved                   Plan - 04/03/23 1611     Clinical Impression Statement Pt present at OT evaluation with diagnosis of L distal radius fx with reduction in ED on 12/22/22. Pt 14  wks out from injury-  Pt made great progress  in AROM for L wrist and forearm in all planes -  grip about same the last 2 times but prehension strenght increased- Upgrade her putty to firm dark blue and pulling with ulnar side of hand. 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 Pt to cont with ROM HEP in the am after using moist heat. Pt met all goals and can cont at home with HEP - discharge from OT services.    OT Occupational Profile and History Problem Focused Assessment - Including review of records relating to presenting problem    Occupational performance deficits (Please refer to evaluation for details): ADL's;IADL's;Play;Social Participation;Leisure  Body  Structure / Function / Physical Skills ADL;IADL;Pain;Strength;ROM;UE functional use;Flexibility    Rehab Potential Good    Clinical Decision Making Limited treatment options, no task modification necessary    Comorbidities Affecting Occupational Performance: None    Modification or Assistance to Complete Evaluation  No modification of tasks or assist necessary to complete eval    OT Frequency 1x / week    OT Duration --   1wk   OT Treatment/Interventions Self-care/ADL training;Contrast Bath;DME and/or AE instruction;Manual Therapy;Passive range of motion;Fluidtherapy;Paraffin;Splinting;Patient/family education;Therapeutic exercise    Consulted and Agree with Plan of Care Patient             Patient will benefit from skilled therapeutic intervention in order to improve the following deficits and impairments:   Body Structure / Function / Physical Skills: ADL, IADL, Pain, Strength, ROM, UE functional use, Flexibility       Visit Diagnosis: Stiffness of left wrist, not elsewhere classified  Pain in left wrist  Muscle weakness (generalized)    Problem List Patient Active Problem List   Diagnosis Date Noted   Hyperlipidemia 03/19/2023   History of colonic polyps 06/10/2022   Polyp of descending colon 06/10/2022   Palpitations 09/08/2019   Urinary incontinence 06/28/2016   Preventative health care 06/28/2016   Insomnia 10/26/2015   Depression 04/28/2015   Attention deficit hyperactivity disorder 04/28/2015    Oletta Cohn, OTR/L,CLT 04/03/2023, 4:15 PM  [facility] [address] [place], [address] Phone: [phone]   Fax:  [phone]  Name: Karen Prince MRN: [id number] Date of Birth: 1956-06-18

## 2023-04-07 ENCOUNTER — Other Ambulatory Visit: Payer: Self-pay | Admitting: Primary Care

## 2023-04-07 DIAGNOSIS — F3342 Major depressive disorder, recurrent, in full remission: Secondary | ICD-10-CM

## 2023-04-29 ENCOUNTER — Other Ambulatory Visit: Payer: Self-pay | Admitting: Primary Care

## 2023-04-29 DIAGNOSIS — E785 Hyperlipidemia, unspecified: Secondary | ICD-10-CM

## 2023-05-26 ENCOUNTER — Other Ambulatory Visit: Payer: Self-pay | Admitting: Primary Care

## 2023-05-26 DIAGNOSIS — F3342 Major depressive disorder, recurrent, in full remission: Secondary | ICD-10-CM

## 2023-06-03 ENCOUNTER — Ambulatory Visit (INDEPENDENT_AMBULATORY_CARE_PROVIDER_SITE_OTHER): Payer: Medicare Other

## 2023-06-03 VITALS — Ht 69.0 in | Wt 192.0 lb

## 2023-06-03 DIAGNOSIS — Z0000 Encounter for general adult medical examination without abnormal findings: Secondary | ICD-10-CM

## 2023-06-03 NOTE — Patient Instructions (Signed)
Ms. Glasser , Thank you for taking time to come for your Medicare Wellness Visit. I appreciate your ongoing commitment to your health goals. Please review the following plan we discussed and let me know if I can assist you in the future.   Referrals/Orders/Follow-Ups/Clinician Recommendations: none  This is a list of the screening recommended for you and due dates:  Health Maintenance  Topic Date Due   COVID-19 Vaccine (5 - 2023-24 season) 03/30/2023   Flu Shot  10/27/2023*   Medicare Annual Wellness Visit  06/02/2024   Mammogram  07/18/2024   DTaP/Tdap/Td vaccine (3 - Td or Tdap) 06/06/2032   Colon Cancer Screening  06/10/2032   Pneumonia Vaccine  Completed   DEXA scan (bone density measurement)  Completed   Hepatitis C Screening  Completed   Zoster (Shingles) Vaccine  Completed   HPV Vaccine  Aged Out  *Topic was postponed. The date shown is not the original due date.    Advanced directives: (Copy Requested) Please bring a copy of your health care power of attorney and living will to the office to be added to your chart at your convenience.  Next Medicare Annual Wellness Visit scheduled for next year: Yes 06/04/2024 @ 2:20pm telephone

## 2023-06-03 NOTE — Progress Notes (Signed)
Subjective:   Karen Prince is a 67 y.o. female who presents for Medicare Annual (Subsequent) preventive examination.  Visit Complete: Virtual I connected with  Karen Prince on 06/03/23 by a audio enabled telemedicine application and verified that I am speaking with the correct person using two identifiers.  Patient Location: Home  Provider Location: Office/Clinic  I discussed the limitations of evaluation and management by telemedicine. The patient expressed understanding and agreed to proceed.  Vital Signs: Because this visit was a virtual/telehealth visit, some criteria may be missing or patient reported. Any vitals not documented were not able to be obtained and vitals that have been documented are patient reported.  Patient Medicare AWV questionnaire was completed by the patient on 05/30/23; I have confirmed that all information answered by patient is correct and no changes since this date. Cardiac Risk Factors include: advanced age (>24men, >47 women);dyslipidemia    Objective:    Today's Vitals   06/03/23 1431  Weight: 192 lb (87.1 kg)  Height: 5\' 9"  (1.753 m)   Body mass index is 28.35 kg/m.     06/03/2023    2:38 PM 06/10/2022    7:19 AM 05/22/2022   12:06 PM 09/06/2019    9:13 PM 08/27/2016    9:15 AM  Advanced Directives  Does Patient Have a Medical Advance Directive? Yes Yes Yes Yes Yes  Type of Estate agent of [place];Living will Living will Healthcare Power of [place];Living will Living will;Healthcare Power of Attorney   Does patient want to make changes to medical advance directive?    No - Guardian declined   Copy of Healthcare Power of Attorney in Chart? No - copy requested  No - copy requested No - copy requested     Current Medications (verified) Outpatient Encounter Medications as of 06/03/2023  Medication Sig   atorvastatin (LIPITOR) 20 MG tablet TAKE 1 TABLET BY MOUTH DAILY FOR CHOLESTEROL   buPROPion (WELLBUTRIN SR) 150 MG  12 hr tablet TAKE 1 TABLET BY MOUTH DAILY FOR DEPRESSION   CALCIUM PO Take 1,200 mg by mouth daily.   Cholecalciferol (VITAMIN D3) 2000 UNITS capsule Take 2,000 Units by mouth every other day.    DULoxetine (CYMBALTA) 60 MG capsule TAKE 1 CAPSULE BY MOUTH DAILY FOR DEPRESSION AND ANXIETY   Melatonin 3 MG TABS Take 3 mg by mouth.   Omega-3 Fatty Acids (FISH OIL) 1000 MG CAPS Take by mouth daily.   zaleplon (SONATA) 10 MG capsule Take 1 capsule (10 mg total) by mouth at bedtime as needed for sleep.   No facility-administered encounter medications on file as of 06/03/2023.    Allergies (verified) Patient has no known allergies.   History: Past Medical History:  Diagnosis Date   ADHD (attention deficit hyperactivity disorder)    Anemia    Anxiety    Arthritis    Cataract    Chicken pox    Depression    Essential tremor    Hearing loss    follows with audologist   Heart murmur    Hyperlipidemia 03/19/2023   IBS (irritable bowel syndrome)    Insomnia    Leucopenia    has blood draw every 5 years   Measles    Mitral valve prolapse    Palpitations    PVC (premature ventricular contraction)    Urinary incontinence    Past Surgical History:  Procedure Laterality Date   COLONOSCOPY WITH PROPOFOL N/A 01/04/2016   Procedure: COLONOSCOPY WITH PROPOFOL;  Surgeon: Wilber Bihari  Mechele Collin, MD;  Location: [facility] ENDOSCOPY;  Service: Endoscopy;  Laterality: N/A;   COLONOSCOPY WITH PROPOFOL N/A 06/10/2022   Procedure: COLONOSCOPY WITH PROPOFOL;  Surgeon: Toney Reil, MD;  Location: [facility] ENDOSCOPY;  Service: Gastroenterology;  Laterality: N/A;   INGUINAL HERNIA REPAIR Right 1984   surgery performed to remove endometrial mass and hernai was found secondary   KNEE ARTHROSCOPY Right 1993   Family History  Problem Relation Age of Onset   Hypertension Father    Macular degeneration Father    Heart disease Father    Emphysema Father    Vision loss Father    Melanoma Mother 84   Anuerysm  Mother        Abdominal   Emphysema Mother    Miscarriages / [place] Mother    Vision loss Mother    Breast cancer Paternal Grandmother        80s   Cervical cancer Maternal Grandmother    Ovarian cancer Cousin    Heart disease Brother    Social History   Socioeconomic History   Marital status: Media planner    Spouse name: Not on file   Number of children: Not on file   Years of education: Not on file   Highest education level: Doctorate  Occupational History   Not on file  Tobacco Use   Smoking status: Never   Smokeless tobacco: Never  Vaping Use   Vaping status: Never Used  Substance and Sexual Activity   Alcohol use: Yes    Alcohol/week: 4.0 standard drinks of alcohol    Types: 2 Glasses of wine, 2 Standard drinks or equivalent per week    Comment: occ   Drug use: No   Sexual activity: Yes    Birth control/protection: Post-menopausal  Other Topics Concern   Not on file  Social History Narrative   Single, has a partner.   Works as a Professor of Physical Therapy.   Works at [employer].   Enjoys photography, travel, being outdoors, relaxing.   Social Determinants of Health   Financial Resource Strain: Low Risk  (05/30/2023)   Overall Financial Resource Strain (CARDIA)    Difficulty of Paying Living Expenses: Not hard at all  Food Insecurity: No Food Insecurity (05/30/2023)   Hunger Vital Sign    Worried About Running Out of Food in the Last Year: Never true    Ran Out of Food in the Last Year: Never true  Transportation Needs: No Transportation Needs (05/30/2023)   PRAPARE - Administrator, Civil Service (Medical): No    Lack of Transportation (Non-Medical): No  Physical Activity: Sufficiently Active (05/30/2023)   Exercise Vital Sign    Days of Exercise per Week: 6 days    Minutes of Exercise per Session: 30 min  Recent Concern: Physical Activity - Insufficiently Active (03/17/2023)   Exercise Vital Sign    Days of Exercise per Week: 7 days     Minutes of Exercise per Session: 20 min  Stress: Stress Concern Present (05/30/2023)   [employer] of Occupational Health - Occupational Stress Questionnaire    Feeling of Stress : To some extent  Social Connections: Moderately Isolated (05/30/2023)   Social Connection and Isolation Panel [NHANES]    Frequency of Communication with Friends and Family: Never    Frequency of Social Gatherings with Friends and Family: Once a week    Attends Religious Services: Never    Database administrator or Organizations: No    Attends [employer]  or Organization Meetings: 1 to 4 times per year    Marital Status: Living with partner    Tobacco Counseling Counseling given: Not Answered   Clinical Intake:  Pre-visit preparation completed: No  Pain : No/denies pain     BMI - recorded: 28.35 Nutritional Status: BMI 25 -29 Overweight Nutritional Risks: None Diabetes: No  How often do you need to have someone help you when you read instructions, pamphlets, or other written materials from your doctor or pharmacy?: 2 - Rarely  Interpreter Needed?: No  Comments: lives with partner Information entered by :: B.Denver Harder,LPN   Activities of Daily Living    05/30/2023   12:10 PM 03/19/2023   10:29 AM  In your present state of health, do you have any difficulty performing the following activities:  Hearing? 1 1  Comment  hearing aid  Vision? 0 1  Comment  has glasses  Difficulty concentrating or making decisions? 0 0  Walking or climbing stairs? 0 0  Dressing or bathing? 0 0  Doing errands, shopping? 0 0  Preparing Food and eating ? N   Using the Toilet? N   In the past six months, have you accidently leaked urine? Y   Do you have problems with loss of bowel control? N   Managing your Medications? N   Managing your Finances? N   Housekeeping or managing your Housekeeping? N     Patient Care Team: Doreene Nest, NP as PCP - General (Nurse Practitioner) Pa, [facility]  [facility])  Indicate any recent Medical Services you may have received from other than [facility] providers in the past year (date may be approximate).     Assessment:   This is a routine wellness examination for Jasiyah.  Hearing/Vision screen Hearing Screening - Comments:: Pt says she has had no hearing changes;wears hearing aids Vision Screening - Comments:: Pt says good with glasses [facility]   Goals Addressed             This Visit's Progress    COMPLETED: Increase physical activity   On track    Consist 30 minute walking     Patient Stated       Pt would like to stay active and healthy;no falls       Depression Screen    06/03/2023    2:37 PM 03/19/2023   11:02 AM 05/22/2022   12:13 PM 03/14/2022    9:03 AM 04/28/2015    1:25 PM  PHQ 2/9 Scores  PHQ - 2 Score 0 0 0 0 0  PHQ- 9 Score  2 5 0     Fall Risk    05/30/2023   12:10 PM 03/19/2023   11:02 AM 03/19/2023   10:29 AM 05/22/2022   12:06 PM  Fall Risk   Falls in the past year? 1 1 1 1   Number falls in past yr: 1 0 0 0  Injury with Fall? 1 0 1 0  Risk for fall due to : History of fall(s) No Fall Risks History of fall(s)   Follow up Education provided;Falls prevention discussed Falls evaluation completed Falls evaluation completed Education provided;Falls prevention discussed;Falls evaluation completed    MEDICARE RISK AT HOME: Medicare Risk at Home Any stairs in or around the home?: Yes If so, are there any without handrails?: Yes Home free of loose throw rugs in walkways, pet beds, electrical cords, etc?: Yes Adequate lighting in your home to reduce risk of falls?: Yes Life alert?: No Use  of a cane, walker or w/c?: No Grab bars in the bathroom?: Yes Shower chair or bench in shower?: Yes Elevated toilet seat or a handicapped toilet?: Yes  TIMED UP AND GO:  Was the test performed?  No    Cognitive Function:        06/03/2023    2:40 PM 05/22/2022   12:09 PM  6CIT Screen  What Year? 0 points 0  points  What month? 0 points 0 points  What time? 0 points 0 points  Count back from 20 0 points 0 points  Months in reverse 0 points 0 points  Repeat phrase 0 points 0 points  Total Score 0 points 0 points    Immunizations Immunization History  Administered Date(s) Administered   DTP 11/27/2010   Hepatitis A 11/27/2010   Janssen (J&J) SARS-COV-2 Vaccination 10/28/2019   Moderna Covid-19 Vaccine Bivalent Booster 61yrs & up 09/02/2022   Moderna SARS-COV2 Booster Vaccination 07/10/2020   PNEUMOCOCCAL CONJUGATE-20 03/14/2022   Pfizer Covid-19 Vaccine Bivalent Booster 31yrs & up 06/19/2021   Tdap 06/06/2022   Zoster Recombinant(Shingrix) 01/31/2017, 06/04/2017    TDAP status: Up to date  Flu Vaccine status: Declined, Education has been provided regarding the importance of this vaccine but patient still declined. Advised may receive this vaccine at local pharmacy or Health Dept. Aware to provide a copy of the vaccination record if obtained from local pharmacy or Health Dept. Verbalized acceptance and understanding.  Pneumococcal vaccine status: Up to date  Covid-19 vaccine status: Completed vaccines  Qualifies for Shingles Vaccine? Yes   Zostavax completed Yes   Shingrix Completed?: Yes  Screening Tests Health Maintenance  Topic Date Due   COVID-19 Vaccine (5 - 2023-24 season) 06/19/2023 (Originally 03/30/2023)   INFLUENZA VACCINE  10/27/2023 (Originally 02/27/2023)   Medicare Annual Wellness (AWV)  06/02/2024   MAMMOGRAM  07/18/2024   DTaP/Tdap/Td (3 - Td or Tdap) 06/06/2032   Colonoscopy  06/10/2032   Pneumonia Vaccine 52+ Years old  Completed   DEXA SCAN  Completed   Hepatitis C Screening  Completed   Zoster Vaccines- Shingrix  Completed   HPV VACCINES  Aged Out    Health Maintenance  There are no preventive care reminders to display for this patient.   Colorectal cancer screening: Type of screening: Colonoscopy. Completed 06/10/2022. Repeat every 10  years  Mammogram status: Completed 07/18/2024. Repeat every year  Bone Density status: Completed 07/18/2022. Results reflect: Bone density results: NORMAL. Repeat every 5 years.  Lung Cancer Screening: (Low Dose CT Chest recommended if Age 33-80 years, 20 pack-year currently smoking OR have quit w/in 15years.) does not qualify.   Lung Cancer Screening Referral: no  Additional Screening:  Hepatitis C Screening: does not qualify; Completed 03/14/2022  Vision Screening: Recommended annual ophthalmology exams for early detection of glaucoma and other disorders of the eye. Is the patient up to date with their annual eye exam?  Yes  Who is the provider or what is the name of the office in which the patient attends annual eye exams?  Eye If pt is not established with a provider, would they like to be referred to a provider to establish care? No .   Dental Screening: Recommended annual dental exams for proper oral hygiene  Diabetic Foot Exam: n/a  Community Resource Referral / Chronic Care Management: CRR required this visit?  No   CCM required this visit?  No    Plan:     I have personally reviewed and noted the following  in the patient's chart:   Medical and social history Use of alcohol, tobacco or illicit drugs  Current medications and supplements including opioid prescriptions. Patient is not currently taking opioid prescriptions. Functional ability and status Nutritional status Physical activity Advanced directives List of other physicians Hospitalizations, surgeries, and ER visits in previous 12 months Vitals Screenings to include cognitive, depression, and falls Referrals and appointments  In addition, I have reviewed and discussed with patient certain preventive protocols, quality metrics, and best practice recommendations. A written personalized care plan for preventive services as well as general preventive health recommendations were provided to patient.     Sue Lush, LPN   43/09/2949   After Visit Summary: (MyChart) Due to this being a telephonic visit, the after visit summary with patients personalized plan was offered to patient via MyChart   Nurse Notes: The patient states they are doing well. She does inquire if she can get a referral for hypnotism to remember her childhood. 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 Pt states she is active on my chart to receive messages. She has no concerns or questions at this time.

## 2023-08-27 DIAGNOSIS — K080 Exfoliation of teeth due to systemic causes: Secondary | ICD-10-CM | POA: Diagnosis not present

## 2023-08-29 ENCOUNTER — Other Ambulatory Visit: Payer: Self-pay | Admitting: Primary Care

## 2023-08-29 DIAGNOSIS — G4700 Insomnia, unspecified: Secondary | ICD-10-CM

## 2023-09-11 NOTE — Telephone Encounter (Signed)
Ppw printed & placed in Karen Prince's box for review.

## 2023-09-23 ENCOUNTER — Telehealth: Payer: Self-pay

## 2023-09-23 DIAGNOSIS — R7303 Prediabetes: Secondary | ICD-10-CM

## 2023-09-23 NOTE — Telephone Encounter (Signed)
 Copied from CRM [id number]. Topic: Clinical - Request for Lab/Test Order >> Sep 23, 2023 11:59 AM Karen Prince wrote: Reason for CRM: Patient called in to have 6 month blood test ordered

## 2023-09-23 NOTE — Addendum Note (Signed)
 Addended by: Doreene Nest on: 09/23/2023 01:30 PM   Modules accepted: Orders

## 2023-09-23 NOTE — Telephone Encounter (Signed)
 It looks like this was for point-of-care A1c which expired. Okay to set up a lab appointment.  New orders placed.

## 2023-09-23 NOTE — Telephone Encounter (Signed)
 Called pt and schedule appt for lab

## 2023-09-24 ENCOUNTER — Other Ambulatory Visit (INDEPENDENT_AMBULATORY_CARE_PROVIDER_SITE_OTHER): Payer: Medicare Other

## 2023-09-24 DIAGNOSIS — R7303 Prediabetes: Secondary | ICD-10-CM | POA: Diagnosis not present

## 2023-09-24 LAB — POCT GLYCOSYLATED HEMOGLOBIN (HGB A1C): Hemoglobin A1C: 5.5 % (ref 4.0–5.6)

## 2023-12-03 DIAGNOSIS — K080 Exfoliation of teeth due to systemic causes: Secondary | ICD-10-CM | POA: Diagnosis not present

## 2023-12-04 ENCOUNTER — Other Ambulatory Visit: Payer: Self-pay | Admitting: Primary Care

## 2023-12-04 DIAGNOSIS — G4700 Insomnia, unspecified: Secondary | ICD-10-CM

## 2023-12-04 DIAGNOSIS — K080 Exfoliation of teeth due to systemic causes: Secondary | ICD-10-CM | POA: Diagnosis not present

## 2023-12-23 DIAGNOSIS — K080 Exfoliation of teeth due to systemic causes: Secondary | ICD-10-CM | POA: Diagnosis not present

## 2024-01-20 ENCOUNTER — Other Ambulatory Visit: Payer: Self-pay | Admitting: Primary Care

## 2024-01-20 DIAGNOSIS — E785 Hyperlipidemia, unspecified: Secondary | ICD-10-CM

## 2024-01-20 NOTE — Telephone Encounter (Signed)
Patient is due for CPE/follow up in late August, this will be required prior to any further refills.  Please schedule, thank you!   

## 2024-01-20 NOTE — Telephone Encounter (Signed)
 Lvm to schedule CPE Late August, fasting labs done same day

## 2024-01-22 NOTE — Telephone Encounter (Signed)
Sent a My chart Message

## 2024-01-26 ENCOUNTER — Encounter: Payer: Self-pay | Admitting: Primary Care

## 2024-01-26 NOTE — Telephone Encounter (Signed)
 Lvmtcb, 3rd attempt. Letter mailed

## 2024-02-03 ENCOUNTER — Telehealth: Payer: Self-pay

## 2024-02-03 NOTE — Telephone Encounter (Signed)
 Copied from CRM [id number]. Topic: Appointments - Scheduling Inquiry for Clinic >> Feb 03, 2024 12:14 PM [name] wrote: Reason for CRM: Patient asking for Karen Prince to call and schedule her physical. I offered her the first available in September and she refused and said it needs to be late August..

## 2024-02-16 ENCOUNTER — Other Ambulatory Visit: Payer: Self-pay | Admitting: Primary Care

## 2024-02-16 DIAGNOSIS — F3342 Major depressive disorder, recurrent, in full remission: Secondary | ICD-10-CM

## 2024-03-11 ENCOUNTER — Encounter: Admitting: Primary Care

## 2024-03-12 NOTE — Progress Notes (Signed)
 Patient not seen.

## 2024-03-15 ENCOUNTER — Other Ambulatory Visit: Payer: Self-pay | Admitting: Primary Care

## 2024-03-15 DIAGNOSIS — G4700 Insomnia, unspecified: Secondary | ICD-10-CM

## 2024-03-16 ENCOUNTER — Other Ambulatory Visit: Payer: Self-pay | Admitting: Primary Care

## 2024-03-16 DIAGNOSIS — G4700 Insomnia, unspecified: Secondary | ICD-10-CM

## 2024-03-16 MED ORDER — ZALEPLON 10 MG PO CAPS
10.0000 mg | ORAL_CAPSULE | Freq: Every evening | ORAL | 0 refills | Status: DC | PRN
Start: 2024-03-16 — End: 2024-04-02

## 2024-03-22 ENCOUNTER — Other Ambulatory Visit: Payer: Self-pay | Admitting: Primary Care

## 2024-03-22 DIAGNOSIS — G4700 Insomnia, unspecified: Secondary | ICD-10-CM

## 2024-03-23 DIAGNOSIS — K080 Exfoliation of teeth due to systemic causes: Secondary | ICD-10-CM | POA: Diagnosis not present

## 2024-04-01 ENCOUNTER — Ambulatory Visit (INDEPENDENT_AMBULATORY_CARE_PROVIDER_SITE_OTHER): Admitting: Primary Care

## 2024-04-01 ENCOUNTER — Encounter: Payer: Self-pay | Admitting: Primary Care

## 2024-04-01 ENCOUNTER — Other Ambulatory Visit: Payer: Self-pay | Admitting: Primary Care

## 2024-04-01 VITALS — BP 142/86 | HR 72 | Temp 97.4°F | Ht 69.0 in | Wt 197.0 lb

## 2024-04-01 DIAGNOSIS — F3341 Major depressive disorder, recurrent, in partial remission: Secondary | ICD-10-CM | POA: Diagnosis not present

## 2024-04-01 DIAGNOSIS — E785 Hyperlipidemia, unspecified: Secondary | ICD-10-CM | POA: Diagnosis not present

## 2024-04-01 DIAGNOSIS — R232 Flushing: Secondary | ICD-10-CM | POA: Diagnosis not present

## 2024-04-01 DIAGNOSIS — G4700 Insomnia, unspecified: Secondary | ICD-10-CM

## 2024-04-01 DIAGNOSIS — Z0001 Encounter for general adult medical examination with abnormal findings: Secondary | ICD-10-CM | POA: Diagnosis not present

## 2024-04-01 DIAGNOSIS — F3342 Major depressive disorder, recurrent, in full remission: Secondary | ICD-10-CM

## 2024-04-01 DIAGNOSIS — E2839 Other primary ovarian failure: Secondary | ICD-10-CM

## 2024-04-01 DIAGNOSIS — Z1231 Encounter for screening mammogram for malignant neoplasm of breast: Secondary | ICD-10-CM

## 2024-04-01 LAB — LIPID PANEL
Cholesterol: 169 mg/dL (ref 0–200)
HDL: 81.8 mg/dL (ref >39.00)
LDL Cholesterol: 73 mg/dL (ref 0–99)
NonHDL: 87.61
Total CHOL/HDL Ratio: 2
Triglycerides: 74 mg/dL (ref 0.0–149.0)
VLDL: 14.8 mg/dL (ref 0.0–40.0)

## 2024-04-01 LAB — CBC
HCT: 41.1 % (ref 36.0–46.0)
Hemoglobin: 13.5 g/dL (ref 12.0–15.0)
MCHC: 32.9 g/dL (ref 30.0–36.0)
MCV: 85.4 fl (ref 78.0–100.0)
Platelets: 265 K/uL (ref 150.0–400.0)
RBC: 4.82 Mil/uL (ref 3.87–5.11)
RDW: 14 % (ref 11.5–15.5)
WBC: 2.7 K/uL — ABNORMAL LOW (ref 4.0–10.5)

## 2024-04-01 LAB — FOLLICLE STIMULATING HORMONE: FSH: 75.1 m[IU]/mL

## 2024-04-01 LAB — COMPREHENSIVE METABOLIC PANEL WITH GFR
ALT: 7 U/L (ref 0–35)
AST: 15 U/L (ref 0–37)
Albumin: 4.5 g/dL (ref 3.5–5.2)
Alkaline Phosphatase: 67 U/L (ref 39–117)
BUN: 9 mg/dL (ref 6–23)
CO2: 29 meq/L (ref 19–32)
Calcium: 9.5 mg/dL (ref 8.4–10.5)
Chloride: 103 meq/L (ref 96–112)
Creatinine, Ser: 0.8 mg/dL (ref 0.40–1.20)
GFR: 75.13 mL/min (ref >60.00)
Glucose, Bld: 99 mg/dL (ref 70–99)
Potassium: 4.8 meq/L (ref 3.5–5.1)
Sodium: 138 meq/L (ref 135–145)
Total Bilirubin: 0.7 mg/dL (ref 0.2–1.2)
Total Protein: 6.9 g/dL (ref 6.0–8.3)

## 2024-04-01 LAB — LUTEINIZING HORMONE: LH: 38.49 m[IU]/mL

## 2024-04-01 LAB — HEMOGLOBIN A1C: Hgb A1c MFr Bld: 5.9 % (ref 4.6–6.5)

## 2024-04-01 LAB — TSH: TSH: 1.74 u[IU]/mL (ref 0.35–5.50)

## 2024-04-01 NOTE — Assessment & Plan Note (Signed)
 Uncontrolled.   Discussed that hormone testing would not be warranted given her age, she would like to proceed. Labs pending for [facility], LH and estrogen.  We also discussed that hormone labs are not within my area of expertise.  Will also check TSH, metabolic panel, A1c, CBC.  We also discussed that a common side effect of Cymbalta  is sweating.

## 2024-04-01 NOTE — Assessment & Plan Note (Signed)
 No concerns today.  Continue Cymbalta  60 mg daily, Wellbutrin  SR 150 mg daily.

## 2024-04-01 NOTE — Assessment & Plan Note (Signed)
 Immunizations UTD. Declines influenza vaccine.  Mammogram and bone density scan due later this year, orders placed. Colonoscopy UTD, due 2033  Discussed the importance of a healthy diet and regular exercise in order for weight loss, and to reduce the risk of further co-morbidity.  Exam stable. Labs pending.  Follow up in 1 year for repeat physical.

## 2024-04-01 NOTE — Assessment & Plan Note (Signed)
 Repeat lipid panel pending. Continue atorvastatin 20 mg daily.

## 2024-04-01 NOTE — Progress Notes (Signed)
 Subjective:    Patient ID: Corean Yoshimura, female    DOB: 19-Dec-1955, 68 y.o.   MRN: [id number]  Maryellen Dowdle is a very pleasant 68 y.o. female who presents today for complete physical and follow up of chronic conditions.  She would like her hormones tested today due to hot flashes and increased sweating. Her sweating is so intense that she will soak her clothes and drip from her face if she does any work outdoors or indoors. The intense sweating began over the last few months. She is managed on Cymbalta . She denies changes in medications, supplements, unexplained weight loss.   Immunizations: -Tetanus: Completed in 2023 -Influenza: Declines today -Shingles: Completed Shingrix series -Pneumonia: Completed Prevnar 20 2023  Diet: Fair diet.  Exercise: No regular exercise.  Eye exam: Completes annually  Dental exam: Completes semi-annually     Mammogram: Completed in December 2023 Bone Density Scan: Completed in December 2023  Colonoscopy: Completed in 2023, due 2033  BP Readings from Last 3 Encounters:  04/01/24 (!) 142/86  03/19/23 120/60  12/22/22 134/82       Review of Systems  Constitutional:  Negative for unexpected weight change.  HENT:  Negative for rhinorrhea.   Respiratory:  Negative for cough and shortness of breath.   Cardiovascular:  Negative for chest pain.  Gastrointestinal:  Negative for constipation and diarrhea.  Genitourinary:  Negative for difficulty urinating.       Hot flashes  Musculoskeletal:  Negative for arthralgias and myalgias.  Skin:  Negative for rash.       Increased sweating   Allergic/Immunologic: Negative for environmental allergies.  Neurological:  Negative for dizziness, numbness and headaches.         Past Medical History:  Diagnosis Date   ADHD (attention deficit hyperactivity disorder)    Anemia    Anxiety    Arthritis    Cataract    Chicken pox    Depression    Essential tremor    Hearing loss    follows with  audologist   Heart murmur    Hyperlipidemia 03/19/2023   IBS (irritable bowel syndrome)    Insomnia    Leucopenia    has blood draw every 5 years   Measles    Mitral valve prolapse    Palpitations    PVC (premature ventricular contraction)    Urinary incontinence     Social History   Socioeconomic History   Marital status: Media planner    Spouse name: Not on file   Number of children: Not on file   Years of education: Not on file   Highest education level: Doctorate  Occupational History   Not on file  Tobacco Use   Smoking status: Never   Smokeless tobacco: Never  Vaping Use   Vaping status: Never Used  Substance and Sexual Activity   Alcohol use: Yes    Alcohol/week: 4.0 standard drinks of alcohol    Types: 2 Glasses of wine, 2 Standard drinks or equivalent per week    Comment: occ   Drug use: No   Sexual activity: Yes    Birth control/protection: Post-menopausal  Other Topics Concern   Not on file  Social History Narrative   Single, has a partner.   Works as a Professor of Physical Therapy.   Works at [employer].   Enjoys photography, travel, being outdoors, relaxing.   Social Drivers of Corporate investment banker Strain: Low Risk  (03/28/2024)   Overall Financial Resource Strain (CARDIA)  Difficulty of Paying Living Expenses: Not hard at all  Food Insecurity: No Food Insecurity (03/28/2024)   Hunger Vital Sign    Worried About Running Out of Food in the Last Year: Never true    Ran Out of Food in the Last Year: Never true  Transportation Needs: No Transportation Needs (03/28/2024)   PRAPARE - Administrator, Civil Service (Medical): No    Lack of Transportation (Non-Medical): No  Physical Activity: Sufficiently Active (03/28/2024)   Exercise Vital Sign    Days of Exercise per Week: 6 days    Minutes of Exercise per Session: 30 min  Stress: Stress Concern Present (03/28/2024)   [employer] of Occupational Health - Occupational Stress  Questionnaire    Feeling of Stress: Rather much  Social Connections: Unknown (03/28/2024)   Social Connection and Isolation Panel    Frequency of Communication with Friends and Family: Patient declined    Frequency of Social Gatherings with Friends and Family: Patient declined    Attends Religious Services: Never    Database administrator or Organizations: No    Attends Engineer, structural: Not on file    Marital Status: Living with partner  Intimate Partner Violence: Not At Risk (06/03/2023)   Humiliation, Afraid, Rape, and Kick questionnaire    Fear of Current or Ex-Partner: No    Emotionally Abused: No    Physically Abused: No    Sexually Abused: No    Past Surgical History:  Procedure Laterality Date   COLONOSCOPY WITH PROPOFOL  N/A 01/04/2016   Procedure: COLONOSCOPY WITH PROPOFOL ;  Surgeon: Lamar ONEIDA Holmes, MD;  Location[facility] ENDOSCOPY;  Service: Endoscopy;  Laterality: N/A;   COLONOSCOPY WITH PROPOFOL  N/A 06/10/2022   Procedure: COLONOSCOPY WITH PROPOFOL ;  Surgeon: Unk Corinn Skiff, MD;  Location: [facility] ENDOSCOPY;  Service: Gastroenterology;  Laterality: N/A;   INGUINAL HERNIA REPAIR Right 1984   surgery performed to remove endometrial mass and hernai was found secondary   KNEE ARTHROSCOPY Right 1993    Family History  Problem Relation Age of Onset   Hypertension Father    Macular degeneration Father    Heart disease Father    Emphysema Father    Vision loss Father    Melanoma Mother 35   Anuerysm Mother        Abdominal   Emphysema Mother    Miscarriages / [place] Mother    Vision loss Mother    Breast cancer Paternal Grandmother        80s   Cervical cancer Maternal Grandmother    Ovarian cancer Cousin    Heart disease Brother     No Known Allergies  Current Outpatient Medications on File Prior to Visit  Medication Sig Dispense Refill   atorvastatin  (LIPITOR) 20 MG tablet TAKE 1 TABLET BY MOUTH DAILY FOR CHOLESTEROL 90 tablet 0   buPROPion   (WELLBUTRIN  SR) 150 MG 12 hr tablet TAKE 1 TABLET BY MOUTH DAILY FOR DEPRESSION 90 tablet 0   CALCIUM  PO Take 1,200 mg by mouth daily.     Cholecalciferol (VITAMIN D3) 2000 UNITS capsule Take 2,000 Units by mouth every other day.      DULoxetine  (CYMBALTA ) 60 MG capsule TAKE 1 CAPSULE BY MOUTH DAILY FOR DEPRESSION AND ANXIETY 90 capsule 3   Melatonin 3 MG TABS Take 3 mg by mouth.     Omega-3 Fatty Acids (FISH OIL) 1000 MG CAPS Take by mouth daily.     zaleplon  (SONATA ) 10 MG capsule Take 1  capsule (10 mg total) by mouth at bedtime as needed for sleep. 30 capsule 0   No current facility-administered medications on file prior to visit.    BP (!) 142/86   Pulse 72   Temp (!) 97.4 F (36.3 C) (Temporal)   Ht 5' 9 (1.753 m)   Wt 197 lb (89.4 kg)   SpO2 97%   BMI 29.09 kg/m  Objective:   Physical Exam HENT:     Right Ear: Tympanic membrane and ear canal normal.     Left Ear: Tympanic membrane and ear canal normal.  Eyes:     Pupils: Pupils are equal, round, and reactive to light.  Cardiovascular:     Rate and Rhythm: Normal rate and regular rhythm.  Pulmonary:     Effort: Pulmonary effort is normal.     Breath sounds: Normal breath sounds.  Abdominal:     General: Bowel sounds are normal.     Palpations: Abdomen is soft.     Tenderness: There is no abdominal tenderness.  Musculoskeletal:        General: Normal range of motion.     Cervical back: Neck supple.  Skin:    General: Skin is warm and dry.  Neurological:     Mental Status: She is alert and oriented to person, place, and time.     Cranial Nerves: No cranial nerve deficit.     Deep Tendon Reflexes:     Reflex Scores:      Patellar reflexes are 2+ on the right side and 2+ on the left side. Psychiatric:        Mood and Affect: Mood normal.     Physical Exam        Assessment & Plan:  Encounter for annual general medical examination with abnormal findings in adult Assessment & Plan: Immunizations UTD.  Declines influenza vaccine.  Mammogram and bone density scan due later this year, orders placed. Colonoscopy UTD, due 2033  Discussed the importance of a healthy diet and regular exercise in order for weight loss, and to reduce the risk of further co-morbidity.  Exam stable. Labs pending.  Follow up in 1 year for repeat physical.    Insomnia, unspecified type Assessment & Plan: Controlled.  Continue Sonata  10 mg PRN.    Recurrent major depressive disorder, in partial remission (HCC) Assessment & Plan: No concerns today.  Continue Cymbalta  60 mg daily, Wellbutrin  SR 150 mg daily.    Hyperlipidemia, unspecified hyperlipidemia type Assessment & Plan: Repeat lipid panel pending.  Continue atorvastatin  20 mg daily.  Orders: -     Lipid panel -     Hemoglobin A1c -     Comprehensive metabolic panel with GFR  Hot flashes Assessment & Plan: Uncontrolled.   Discussed that hormone testing would not be warranted given her age, she would like to proceed. Labs pending for [facility], LH and estrogen.  We also discussed that hormone labs are not within my area of expertise.  Will also check TSH, metabolic panel, A1c, CBC.  We also discussed that a common side effect of Cymbalta  is sweating.  Orders: -     TSH -     CBC -     Estrogens, total -     Follicle stimulating hormone -     Luteinizing hormone  Screening mammogram for breast cancer -     3D Screening Mammogram, Left and Right; Future  Estrogen deficiency -     DG Bone Density; Future  Assessment and Plan Assessment & Plan         Comer MARLA Gaskins, NP      History of Present Illness

## 2024-04-01 NOTE — Assessment & Plan Note (Signed)
 Controlled.  Continue Sonata  10 mg PRN.

## 2024-04-01 NOTE — Patient Instructions (Signed)
Stop by the lab prior to leaving today. I will notify you of your results once received.   [facility] to schedule your mammogram and bone density scan.   It was a pleasure to see you today!   

## 2024-04-02 ENCOUNTER — Ambulatory Visit: Payer: Self-pay | Admitting: Primary Care

## 2024-04-02 MED ORDER — ZALEPLON 10 MG PO CAPS: 10.0000 mg | ORAL_CAPSULE | Freq: Every evening | ORAL | 0 refills | Status: DC | PRN

## 2024-04-02 NOTE — Telephone Encounter (Signed)
 Spoke with [facility], states they did not receive prescription on 03/16/24 for Sonata . Last script they have on file was from May 2025.

## 2024-04-02 NOTE — Telephone Encounter (Signed)
 Call [facility]. I sent a prescription for Sonata  on 03/16/24. They should have this on file.

## 2024-04-02 NOTE — Telephone Encounter (Signed)
Noted, prescription sent to pharmacy. 

## 2024-04-05 LAB — ESTROGENS, TOTAL: Estrogen: 93 pg/mL

## 2024-04-15 ENCOUNTER — Other Ambulatory Visit: Payer: Self-pay | Admitting: Primary Care

## 2024-04-15 DIAGNOSIS — E785 Hyperlipidemia, unspecified: Secondary | ICD-10-CM

## 2024-04-20 HISTORY — PX: OTHER SURGICAL HISTORY: SHX169

## 2024-05-13 ENCOUNTER — Other Ambulatory Visit: Payer: Self-pay | Admitting: Primary Care

## 2024-05-13 DIAGNOSIS — F3342 Major depressive disorder, recurrent, in full remission: Secondary | ICD-10-CM

## 2024-06-03 ENCOUNTER — Ambulatory Visit

## 2024-06-04 ENCOUNTER — Ambulatory Visit: Payer: Medicare Other

## 2024-06-04 VITALS — Ht 69.0 in | Wt 191.0 lb

## 2024-06-04 DIAGNOSIS — Z0000 Encounter for general adult medical examination without abnormal findings: Secondary | ICD-10-CM

## 2024-06-04 NOTE — Patient Instructions (Signed)
 Karen Prince,  Thank you for taking the time for your Medicare Wellness Visit. I appreciate your continued commitment to your health goals. Please review the care plan we discussed, and feel free to reach out if I can assist you further.  Please note that Annual Wellness Visits do not include a physical exam. Some assessments may be limited, especially if the visit was conducted virtually. If needed, we may recommend an in-person follow-up with your provider.  Ongoing Care Seeing your primary care provider every 3 to 6 months helps us  monitor your health and provide consistent, personalized care.   Referrals If a referral was made during today's visit and you haven't received any updates within two weeks, please contact the referred provider directly to check on the status.  Recommended Screenings:  Health Maintenance  Topic Date Due   COVID-19 Vaccine (6 - 2025-26 season) 04/13/2024   Medicare Annual Wellness Visit  06/02/2024   Breast Cancer Screening  07/18/2024   Flu Shot  10/26/2024*   DTaP/Tdap/Td vaccine (3 - Td or Tdap) 06/06/2032   Colon Cancer Screening  06/10/2032   Pneumococcal Vaccine for age over 71  Completed   DEXA scan (bone density measurement)  Completed   Hepatitis C Screening  Completed   Zoster (Shingles) Vaccine  Completed   Meningitis B Vaccine  Aged Out  *Topic was postponed. The date shown is not the original due date.       05/31/2024    1:57 PM  Advanced Directives  Does Patient Have a Medical Advance Directive? Yes  Type of Karen Prince;Living will  Copy of Healthcare Power of Attorney in Chart? Yes - validated most recent copy scanned in chart (See row information)    Vision: Annual vision screenings are recommended for early detection of glaucoma, cataracts, and diabetic retinopathy. These exams can also reveal signs of chronic conditions such as diabetes and high blood pressure.  Dental: Annual dental  screenings help detect early signs of oral cancer, gum disease, and other conditions linked to overall health, including heart disease and diabetes.

## 2024-06-04 NOTE — Progress Notes (Signed)
 Subjective:   Karen Prince is a 68 y.o. female who presents for a Medicare Annual Wellness Visit.  I connected with  Karen Prince on 06/04/24 by a audio enabled telemedicine application and verified that I am speaking with the correct person using two identifiers.  Patient Location: Home  Provider Location: Office/Clinic  Persons Participating in Visit: Patient.  I discussed the limitations of evaluation and management by telemedicine. The patient expressed understanding and agreed to proceed.  Vital Signs: Because this visit was a virtual/telehealth visit, some criteria may be missing or patient reported. Any vitals not documented were not able to be obtained and vitals that have been documented are patient reported.   Allergies (verified) Patient has no known allergies.   History: Past Medical History:  Diagnosis Date   ADHD (attention deficit hyperactivity disorder)    Anemia    Anxiety    Arthritis    Cataract    Chicken pox    Depression    Essential tremor    Hearing loss    follows with audologist   Heart murmur    Hyperlipidemia 03/19/2023   IBS (irritable bowel syndrome)    Insomnia    Leucopenia    has blood draw every 5 years   Measles    Mitral valve prolapse    Palpitations    PVC (premature ventricular contraction)    Urinary incontinence    Past Surgical History:  Procedure Laterality Date   COLONOSCOPY WITH PROPOFOL  N/A 01/04/2016   Procedure: COLONOSCOPY WITH PROPOFOL ;  Surgeon: Lamar ONEIDA Holmes, MD;  Location: [facility] ENDOSCOPY;  Service: Endoscopy;  Laterality: N/A;   COLONOSCOPY WITH PROPOFOL  N/A 06/10/2022   Procedure: COLONOSCOPY WITH PROPOFOL ;  Surgeon: Unk Corinn Skiff, MD;  Location: [facility] ENDOSCOPY;  Service: Gastroenterology;  Laterality: N/A;   extraction of molar Left 04/20/2024   will have implant done next year   INGUINAL HERNIA REPAIR Right 1984   surgery performed to remove endometrial mass and hernai was found secondary    KNEE ARTHROSCOPY Right 1993   Family History  Problem Relation Age of Onset   Hypertension Father    Macular degeneration Father    Heart disease Father    Emphysema Father    Vision loss Father    Melanoma Mother 72   Anuerysm Mother        Abdominal   Emphysema Mother    Miscarriages / Stillbirths Mother    Vision loss Mother    Breast cancer Paternal Grandmother        80s   Cervical cancer Maternal Grandmother    Ovarian cancer Cousin    Heart disease Brother    Social History   Occupational History   Occupation: retired  Tobacco Use   Smoking status: Never   Smokeless tobacco: Never  Vaping Use   Vaping status: Never Used  Substance and Sexual Activity   Alcohol use: Yes    Alcohol/week: 4.0 standard drinks of alcohol    Types: 2 Glasses of wine, 2 Standard drinks or equivalent per week    Comment: occ   Drug use: No   Sexual activity: Yes    Birth control/protection: Post-menopausal   Tobacco Counseling Counseling given: Not Answered  SDOH Screenings   Food Insecurity: No Food Insecurity (06/04/2024)  Housing: Low Risk  (06/04/2024)  Transportation Needs: No Transportation Needs (06/04/2024)  Utilities: Not At Risk (06/04/2024)  Alcohol Screen: Low Risk  (03/28/2024)  Depression (PHQ2-9): Low Risk  (06/04/2024)  Recent Concern:  Depression (PHQ2-9) - Medium Risk (04/01/2024)  Financial Resource Strain: Low Risk  (03/28/2024)  Physical Activity: Sufficiently Active (06/04/2024)  Social Connections: Moderately Isolated (06/04/2024)  Stress: Stress Concern Present (06/04/2024)  Tobacco Use: Low Risk  (06/04/2024)  Health Literacy: Adequate Health Literacy (06/04/2024)   Depression Screen    06/04/2024    2:38 PM 04/01/2024    9:09 AM 06/03/2023    2:37 PM 03/19/2023   11:02 AM 05/22/2022   12:13 PM 03/14/2022    9:03 AM 04/28/2015    1:25 PM  PHQ 2/9 Scores  PHQ - 2 Score 1 2 0 0 0 0 0  PHQ- 9 Score  6   2  5   0       Data saved with a previous flowsheet row  definition     Goals Addressed             This Visit's Progress    Keep active and moving       COMPLETED: Patient Stated       Pt would like to stay active and healthy;no falls       Visit info / Clinical Intake: Medicare Wellness Visit Type:: Subsequent Annual Wellness Visit Medicare Wellness Visit Mode:: Telephone If telephone:: video declined If telephone or video:: unable to obtan vitals due to lack of equipment Interpreter Needed?: No Pre-visit prep was completed: yes AWV questionnaire completed by patient prior to visit?: no Living arrangements:: lives with spouse/significant other Patient's Overall Health Status Rating: good Typical amount of pain: some Does pain affect daily life?: no Are you currently prescribed opioids?: no  Dietary Habits and Nutritional Risks How many meals a day?: 3 Eats fruit and vegetables daily?: (!) no (not every day) Most meals are obtained by: preparing own meals; eating out (a mix of both) In the last 2 weeks, have you had any of the following?: -- (none) Diabetic:: no  Functional Status Activities of Daily Living (to include ambulation/medication): (Patient-Rptd) Independent Ambulation: Independent with device- listed below Home Assistive Devices/Equipment: Cane (cane only for long walks for support) Medication Administration: Independent Home Management: (Patient-Rptd) Independent Manage your own finances?: yes Primary transportation is: driving Concerns about vision?: no *vision screening is required for WTM* Concerns about hearing?: (!) yes Uses hearing aids?: (!) yes Hear whispered voice?: (!) no *in-person visit only*  Fall Screening Falls in the past year?: (Patient-Rptd) 0 Number of falls in past year: (Patient-Rptd) 0 Was there an injury with Fall?: (Patient-Rptd) 0 Fall Risk Category Calculator: (Patient-Rptd) 0 Patient Fall Risk Level: (Patient-Rptd) Low Fall Risk  Fall Risk Patient at Risk for Falls Due to:  No Fall Risks Fall risk Follow up: Falls prevention discussed; Education provided  Home and Transportation Safety: All rugs have non-skid backing?: N/A, no rugs All stairs or steps have railings?: N/A, no stairs Grab bars in the bathtub or shower?: yes Have non-skid surface in bathtub or shower?: yes Good home lighting?: yes Regular seat belt use?: yes Hospital stays in the last year:: no  Cognitive Assessment Difficulty concentrating, remembering, or making decisions? : no Will 6CIT or Mini Cog be Completed: yes What year is it?: 0 points What month is it?: 0 points Give patient an address phrase to remember (5 components): [address] [place]  About what time is it?: 0 points Count backwards from 20 to 1: 0 points Say the months of the year in reverse: 0 points Repeat the address phrase from earlier: 0 points 6 CIT Score: 0  points  Advance Directives (For Healthcare) Does Patient Have a Medical Advance Directive?: Yes Type of Advance Directive: Healthcare Power of Zap; Living will Copy of Healthcare Power of Attorney in Chart?: Yes - validated most recent copy scanned in chart (See row information) Copy of Living Will in Chart?: Yes - validated most recent copy scanned in chart (See row information)  Reviewed/Updated  Reviewed/Updated: All        Objective:    Today's Vitals   06/04/24 1421  Weight: 191 lb (86.6 kg)  Height: 5' 9 (1.753 m)   Body mass index is 28.21 kg/m.  Current Medications (verified) Outpatient Encounter Medications as of 06/04/2024  Medication Sig   atorvastatin  (LIPITOR) 20 MG tablet TAKE 1 TABLET BY MOUTH DAILY FOR CHOLESTEROL   buPROPion  (WELLBUTRIN  SR) 150 MG 12 hr tablet TAKE 1 TABLET BY MOUTH DAILY FOR DEPRESSION   CALCIUM  PO Take 1,200 mg by mouth daily.   Cholecalciferol (VITAMIN D3) 2000 UNITS capsule Take 2,000 Units by mouth every other day.    DULoxetine  (CYMBALTA ) 60 MG capsule TAKE 1 CAPSULE BY MOUTH DAILY FOR  DEPRESSION AND ANXIETY   Melatonin 3 MG TABS Take 3 mg by mouth.   Omega-3 Fatty Acids (FISH OIL) 1000 MG CAPS Take by mouth daily.   zaleplon  (SONATA ) 10 MG capsule Take 1 capsule (10 mg total) by mouth at bedtime as needed for sleep.   No facility-administered encounter medications on file as of 06/04/2024.   Hearing/Vision screen Vision Screening - Comments:: UTD w/visits to [employer] and Health Maintenance Health Maintenance  Topic Date Due   COVID-19 Vaccine (6 - 2025-26 season) 04/13/2024   Medicare Annual Wellness (AWV)  06/02/2024   Mammogram  07/18/2024   Influenza Vaccine  10/26/2024 (Originally 02/27/2024)   DTaP/Tdap/Td (3 - Td or Tdap) 06/06/2032   Colonoscopy  06/10/2032   Pneumococcal Vaccine: 50+ Years  Completed   DEXA SCAN  Completed   Hepatitis C Screening  Completed   Zoster Vaccines- Shingrix  Completed   Meningococcal B Vaccine  Aged Out        Assessment/Plan:  This is a routine wellness examination for Karen Prince.  Patient Care Team: Gretta Comer POUR, NP as PCP - General (Nurse Practitioner) [facility], [facility])  I have personally reviewed and noted the following in the patient's chart:   Medical and social history Use of alcohol, tobacco or illicit drugs  Current medications and supplements including opioid prescriptions. Functional ability and status Nutritional status Physical activity Advanced directives List of other physicians Hospitalizations, surgeries, and ER visits in previous 12 months Vitals Screenings to include cognitive, depression, and falls Referrals and appointments  No orders of the defined types were placed in this encounter.  In addition, I have reviewed and discussed with patient certain preventive protocols, quality metrics, and best practice recommendations. A written personalized care plan for preventive services as well as general preventive health recommendations were provided to  patient.   Erminio LITTIE Saris, LPN   [id number]/08/[id number]   No follow-ups on file.  After Visit Summary: (MyChart) Due to this being a telephonic visit, the after visit summary with patients personalized plan was offered to patient via MyChart   Nurse Notes: Pt has no concerns or questions. AWV will be made for one year once CPE completed and closer to time (as pt has to coordinate other things)

## 2024-07-26 ENCOUNTER — Ambulatory Visit
Admission: RE | Admit: 2024-07-26 | Discharge: 2024-07-26 | Disposition: A | Discharge status: Home / Self Care | Source: Ambulatory Visit | Attending: Primary Care | Admitting: Primary Care

## 2024-07-26 DIAGNOSIS — Z1231 Encounter for screening mammogram for malignant neoplasm of breast: Secondary | ICD-10-CM

## 2024-07-26 DIAGNOSIS — E2839 Other primary ovarian failure: Secondary | ICD-10-CM | POA: Diagnosis present

## 2024-07-27 ENCOUNTER — Other Ambulatory Visit: Payer: Self-pay | Admitting: Primary Care

## 2024-07-27 DIAGNOSIS — G4700 Insomnia, unspecified: Secondary | ICD-10-CM

## 2024-08-18 ENCOUNTER — Encounter (INDEPENDENT_AMBULATORY_CARE_PROVIDER_SITE_OTHER): Payer: Self-pay

## 2024-08-18 DIAGNOSIS — I10 Essential (primary) hypertension: Secondary | ICD-10-CM

## 2024-08-19 DIAGNOSIS — I10 Essential (primary) hypertension: Secondary | ICD-10-CM | POA: Diagnosis not present

## 2024-08-19 MED ORDER — VALSARTAN 80 MG PO TABS: 80.0000 mg | ORAL_TABLET | Freq: Every day | ORAL | 0 refills | Status: AC

## 2024-08-19 NOTE — Telephone Encounter (Signed)
Please see the MyChart message reply(ies) for my assessment and plan.  The patient gave consent for this Medical Advice Message and is aware that it may result in a bill to their insurance company as well as the possibility that this may result in a co-payment or deductible. They are an established patient, but are not seeking medical advice exclusively about a problem treated during an in person or video visit in the last 7 days. I did not recommend an in person or video visit within 7 days of my reply.  I spent a total of 7 minutes cumulative time within 7 days through MyChart messaging Jw Covin K Zeeshan Korte, NP  

## 2024-09-09 ENCOUNTER — Ambulatory Visit: Admitting: Primary Care
